# Patient Record
Sex: Female | Born: 1955
Health system: Southern US, Community
[De-identification: ages and names within clinical notes are randomized; demographics above are authoritative.]

## PROBLEM LIST (undated history)

## (undated) DIAGNOSIS — J189 Pneumonia, unspecified organism: Secondary | ICD-10-CM

## (undated) DIAGNOSIS — F419 Anxiety disorder, unspecified: Secondary | ICD-10-CM

## (undated) DIAGNOSIS — F32A Depression, unspecified: Secondary | ICD-10-CM

## (undated) DIAGNOSIS — F329 Major depressive disorder, single episode, unspecified: Secondary | ICD-10-CM

## (undated) HISTORY — PX: TONSILLECTOMY: SUR1361

## (undated) HISTORY — PX: OTHER SURGICAL HISTORY: SHX169

## (undated) HISTORY — PX: CHEST TUBE INSERTION: SHX231

---

## 2002-06-19 ENCOUNTER — Inpatient Hospital Stay (HOSPITAL_COMMUNITY): Admission: EM | Admit: 2002-06-19 | Discharge: 2002-06-30 | Payer: Self-pay | Admitting: Thoracic Surgery

## 2002-06-20 ENCOUNTER — Encounter: Payer: Self-pay | Admitting: Critical Care Medicine

## 2002-06-20 ENCOUNTER — Encounter: Payer: Self-pay | Admitting: Thoracic Surgery

## 2002-06-21 ENCOUNTER — Encounter: Payer: Self-pay | Admitting: Thoracic Surgery

## 2002-06-22 ENCOUNTER — Encounter: Payer: Self-pay | Admitting: Thoracic Surgery

## 2002-06-23 ENCOUNTER — Encounter: Payer: Self-pay | Admitting: Thoracic Surgery

## 2002-06-24 ENCOUNTER — Encounter: Payer: Self-pay | Admitting: Thoracic Surgery

## 2002-06-25 ENCOUNTER — Encounter: Payer: Self-pay | Admitting: Thoracic Surgery

## 2002-06-26 ENCOUNTER — Encounter: Payer: Self-pay | Admitting: Thoracic Surgery

## 2002-06-27 ENCOUNTER — Encounter: Payer: Self-pay | Admitting: Thoracic Surgery

## 2002-06-28 ENCOUNTER — Encounter: Payer: Self-pay | Admitting: Thoracic Surgery

## 2002-06-29 ENCOUNTER — Encounter: Payer: Self-pay | Admitting: Thoracic Surgery

## 2002-06-30 ENCOUNTER — Encounter: Payer: Self-pay | Admitting: Thoracic Surgery

## 2002-07-10 ENCOUNTER — Encounter: Admission: RE | Admit: 2002-07-10 | Discharge: 2002-07-10 | Payer: Self-pay | Admitting: Thoracic Surgery

## 2002-07-10 ENCOUNTER — Encounter: Payer: Self-pay | Admitting: Thoracic Surgery

## 2002-08-07 ENCOUNTER — Encounter: Payer: Self-pay | Admitting: Thoracic Surgery

## 2002-08-07 ENCOUNTER — Encounter: Admission: RE | Admit: 2002-08-07 | Discharge: 2002-08-07 | Payer: Self-pay | Admitting: Thoracic Surgery

## 2002-09-04 ENCOUNTER — Encounter: Payer: Self-pay | Admitting: Thoracic Surgery

## 2002-09-04 ENCOUNTER — Encounter: Admission: RE | Admit: 2002-09-04 | Discharge: 2002-09-04 | Payer: Self-pay | Admitting: Thoracic Surgery

## 2002-11-06 ENCOUNTER — Encounter: Admission: RE | Admit: 2002-11-06 | Discharge: 2002-11-06 | Payer: Self-pay | Admitting: Thoracic Surgery

## 2002-11-06 ENCOUNTER — Encounter: Payer: Self-pay | Admitting: Thoracic Surgery

## 2003-02-25 ENCOUNTER — Encounter: Payer: Self-pay | Admitting: Thoracic Surgery

## 2003-02-25 ENCOUNTER — Encounter: Admission: RE | Admit: 2003-02-25 | Discharge: 2003-02-25 | Payer: Self-pay | Admitting: Thoracic Surgery

## 2015-06-07 DIAGNOSIS — R05 Cough: Secondary | ICD-10-CM | POA: Diagnosis not present

## 2015-06-07 DIAGNOSIS — J00 Acute nasopharyngitis [common cold]: Secondary | ICD-10-CM | POA: Diagnosis not present

## 2015-06-17 DIAGNOSIS — R05 Cough: Secondary | ICD-10-CM | POA: Diagnosis not present

## 2015-06-17 DIAGNOSIS — J209 Acute bronchitis, unspecified: Secondary | ICD-10-CM | POA: Diagnosis not present

## 2015-08-08 DIAGNOSIS — M722 Plantar fascial fibromatosis: Secondary | ICD-10-CM | POA: Diagnosis not present

## 2016-03-02 DIAGNOSIS — Z23 Encounter for immunization: Secondary | ICD-10-CM | POA: Diagnosis not present

## 2016-03-05 DIAGNOSIS — Z6837 Body mass index (BMI) 37.0-37.9, adult: Secondary | ICD-10-CM | POA: Diagnosis not present

## 2016-03-05 DIAGNOSIS — Z01419 Encounter for gynecological examination (general) (routine) without abnormal findings: Secondary | ICD-10-CM | POA: Diagnosis not present

## 2016-03-19 DIAGNOSIS — H5200 Hypermetropia, unspecified eye: Secondary | ICD-10-CM | POA: Diagnosis not present

## 2016-03-19 DIAGNOSIS — Z0100 Encounter for examination of eyes and vision without abnormal findings: Secondary | ICD-10-CM | POA: Diagnosis not present

## 2016-04-19 DIAGNOSIS — Z1231 Encounter for screening mammogram for malignant neoplasm of breast: Secondary | ICD-10-CM | POA: Diagnosis not present

## 2016-06-18 DIAGNOSIS — J019 Acute sinusitis, unspecified: Secondary | ICD-10-CM | POA: Diagnosis not present

## 2016-06-28 DIAGNOSIS — J209 Acute bronchitis, unspecified: Secondary | ICD-10-CM | POA: Diagnosis not present

## 2016-06-28 DIAGNOSIS — J019 Acute sinusitis, unspecified: Secondary | ICD-10-CM | POA: Diagnosis not present

## 2016-07-18 DIAGNOSIS — R69 Illness, unspecified: Secondary | ICD-10-CM | POA: Diagnosis not present

## 2016-08-21 DIAGNOSIS — M1991 Primary osteoarthritis, unspecified site: Secondary | ICD-10-CM | POA: Diagnosis not present

## 2016-08-21 DIAGNOSIS — Z0000 Encounter for general adult medical examination without abnormal findings: Secondary | ICD-10-CM | POA: Diagnosis not present

## 2016-08-21 DIAGNOSIS — R69 Illness, unspecified: Secondary | ICD-10-CM | POA: Diagnosis not present

## 2016-08-21 DIAGNOSIS — Z6838 Body mass index (BMI) 38.0-38.9, adult: Secondary | ICD-10-CM | POA: Diagnosis not present

## 2016-08-21 DIAGNOSIS — Z791 Long term (current) use of non-steroidal anti-inflammatories (NSAID): Secondary | ICD-10-CM | POA: Diagnosis not present

## 2016-10-18 DIAGNOSIS — Z6837 Body mass index (BMI) 37.0-37.9, adult: Secondary | ICD-10-CM | POA: Diagnosis not present

## 2016-10-18 DIAGNOSIS — J209 Acute bronchitis, unspecified: Secondary | ICD-10-CM | POA: Diagnosis not present

## 2016-11-06 DIAGNOSIS — R69 Illness, unspecified: Secondary | ICD-10-CM | POA: Diagnosis not present

## 2017-02-25 DIAGNOSIS — R69 Illness, unspecified: Secondary | ICD-10-CM | POA: Diagnosis not present

## 2017-03-07 DIAGNOSIS — N3001 Acute cystitis with hematuria: Secondary | ICD-10-CM | POA: Diagnosis not present

## 2017-03-07 DIAGNOSIS — R300 Dysuria: Secondary | ICD-10-CM | POA: Diagnosis not present

## 2017-03-07 DIAGNOSIS — Z6837 Body mass index (BMI) 37.0-37.9, adult: Secondary | ICD-10-CM | POA: Diagnosis not present

## 2017-03-22 DIAGNOSIS — E6609 Other obesity due to excess calories: Secondary | ICD-10-CM | POA: Diagnosis not present

## 2017-03-22 DIAGNOSIS — M179 Osteoarthritis of knee, unspecified: Secondary | ICD-10-CM | POA: Diagnosis not present

## 2017-03-22 DIAGNOSIS — M722 Plantar fascial fibromatosis: Secondary | ICD-10-CM | POA: Diagnosis not present

## 2017-03-22 DIAGNOSIS — K219 Gastro-esophageal reflux disease without esophagitis: Secondary | ICD-10-CM | POA: Diagnosis not present

## 2017-03-25 ENCOUNTER — Other Ambulatory Visit (INDEPENDENT_AMBULATORY_CARE_PROVIDER_SITE_OTHER): Payer: Self-pay | Admitting: Orthopedic Surgery

## 2017-03-25 ENCOUNTER — Emergency Department (HOSPITAL_COMMUNITY): Payer: Medicare HMO

## 2017-03-25 ENCOUNTER — Emergency Department (HOSPITAL_COMMUNITY)
Admission: EM | Admit: 2017-03-25 | Discharge: 2017-03-25 | Disposition: A | Discharge status: Home / Self Care | Payer: Medicare HMO | Attending: Emergency Medicine | Admitting: Emergency Medicine

## 2017-03-25 ENCOUNTER — Encounter (HOSPITAL_COMMUNITY): Payer: Self-pay | Admitting: Emergency Medicine

## 2017-03-25 DIAGNOSIS — W010XXA Fall on same level from slipping, tripping and stumbling without subsequent striking against object, initial encounter: Secondary | ICD-10-CM | POA: Insufficient documentation

## 2017-03-25 DIAGNOSIS — T148XXA Other injury of unspecified body region, initial encounter: Secondary | ICD-10-CM | POA: Diagnosis not present

## 2017-03-25 DIAGNOSIS — S82892A Other fracture of left lower leg, initial encounter for closed fracture: Secondary | ICD-10-CM

## 2017-03-25 DIAGNOSIS — S82842A Displaced bimalleolar fracture of left lower leg, initial encounter for closed fracture: Secondary | ICD-10-CM | POA: Diagnosis not present

## 2017-03-25 DIAGNOSIS — Y998 Other external cause status: Secondary | ICD-10-CM | POA: Insufficient documentation

## 2017-03-25 DIAGNOSIS — S99912A Unspecified injury of left ankle, initial encounter: Secondary | ICD-10-CM | POA: Diagnosis present

## 2017-03-25 DIAGNOSIS — Y929 Unspecified place or not applicable: Secondary | ICD-10-CM | POA: Insufficient documentation

## 2017-03-25 DIAGNOSIS — M25572 Pain in left ankle and joints of left foot: Secondary | ICD-10-CM | POA: Diagnosis not present

## 2017-03-25 DIAGNOSIS — Y9389 Activity, other specified: Secondary | ICD-10-CM | POA: Insufficient documentation

## 2017-03-25 DIAGNOSIS — Y93H3 Activity, building and construction: Secondary | ICD-10-CM | POA: Insufficient documentation

## 2017-03-25 MED ORDER — OXYCODONE-ACETAMINOPHEN 5-325 MG PO TABS: 2.0000 | ORAL_TABLET | ORAL | 0 refills | Status: DC | PRN

## 2017-03-25 MED ORDER — HYDROMORPHONE HCL 2 MG/ML IJ SOLN
2.0000 mg | Freq: Once | INTRAMUSCULAR | Status: AC
  Administered 2017-03-25: 2 mg via INTRAMUSCULAR
  Filled 2017-03-25: qty 1

## 2017-03-25 MED ORDER — ONDANSETRON HCL 4 MG/2ML IJ SOLN
4.0000 mg | Freq: Once | INTRAMUSCULAR | Status: AC
  Administered 2017-03-25: 4 mg via INTRAMUSCULAR
  Filled 2017-03-25: qty 2

## 2017-03-25 NOTE — ED Triage Notes (Signed)
Pt reports left ankle pain after slipping on wet ramp and left ankle folding underneath her. Strong dp pulse palpated. Moderate swelling noted to left ankle. Pt denies hitting head or loc. nad noted.

## 2017-03-25 NOTE — ED Provider Notes (Signed)
[facility] DEPT Provider Note   CSN: [id number] Arrival date & time: 03/25/17  1329     History   Chief Complaint Chief Complaint  Patient presents with  . Ankle Injury    HPI Melissa Landry is a 61 y.o. female.  The history is provided by the patient. No language interpreter was used.  Ankle Injury  This is a new problem. The current episode started less than 1 hour ago. The problem occurs constantly. The problem has been gradually worsening. Nothing aggravates the symptoms. Nothing relieves the symptoms. She has tried nothing for the symptoms. The treatment provided no relief.  Pt reports she slipped on a wet ramp and injured her left ankle.  Pt complains of pain.    History reviewed. No pertinent past medical history.  There are no active problems to display for this patient.   History reviewed. No pertinent surgical history.  OB History    No data available       Home Medications    Prior to Admission medications   Not on File    Family History History reviewed. No pertinent family history.  Social History Social History  Substance Use Topics  . Smoking status: Never Smoker  . Smokeless tobacco: Never Used  . Alcohol use No     Allergies   Patient has no allergy information on record.   Review of Systems Review of Systems  All other systems reviewed and are negative.    Physical Exam Updated Vital Signs BP (!) 105/50 (BP Location: Left Arm)   Pulse (!) 56   Temp 98.1 F (36.7 C) (Oral)   Resp 18   Ht  (1.676 m)   Wt 99.8 kg (220 lb)   SpO2 99%   BMI 35.51 kg/m   Physical Exam  Constitutional: She appears well-developed and well-nourished.  HENT:  Head: Normocephalic.  Musculoskeletal: She exhibits tenderness.  Swollen tender left ankle,  Good pulse  Neurological: She is alert.  Skin: Skin is warm.  Psychiatric: She has a normal mood and affect.  Nursing note and vitals reviewed.    ED Treatments / Results   Labs (all labs ordered are listed, but only abnormal results are displayed) Labs Reviewed - No data to display  EKG  EKG Interpretation None       Radiology Dg Ankle Complete Left  Result Date: 03/25/2017 CLINICAL DATA:  Fall, injury, pain and swelling EXAM: LEFT ANKLE COMPLETE - 3+ VIEW COMPARISON:  None available FINDINGS: Acute mildly displaced bimalleolar left ankle fracture with soft tissue swelling. No associated subluxation or dislocation. Talus and calcaneus appear intact. Plantar calcaneal spurring noted. IMPRESSION: Acute mildly displaced bimalleolar ankle fractures and soft tissue swelling. Electronically Signed   By: Judie Petit.  Shick M.D.   On: 03/25/2017 13:59    Procedures .Splint Application Date/Time: 03/25/2017 3:11 PM Performed by: Elson Areas Authorized by: Elson Areas   Consent:    Consent obtained:  Verbal   Consent given by:  Patient   Alternatives discussed:  No treatment Pre-procedure details:    Sensation:  Normal Procedure details:    Laterality:  Left   Location:  Ankle   Cast type:  Short leg   (including critical care time)  Medications Ordered in ED Medications  HYDROmorphone (DILAUDID) injection 2 mg (2 mg Intramuscular Given 03/25/17 1402)  ondansetron (ZOFRAN) injection 4 mg (4 mg Intramuscular Given 03/25/17 1401)     Initial Impression / Assessment and Plan / ED Course  I have reviewed the triage vital signs and the nursing notes.  Pertinent labs & imaging results that were available during my care of the patient were reviewed by me and considered in my medical decision making (see chart for details).     Splint Crutches  I spoke to Dr. August Saucer.  He will call pt with surgical time tomorrow.   Pt advised of need for surgery   Final Clinical Impressions(s) / ED Diagnoses   Final diagnoses:  Bimalleolar ankle fracture, left, closed, initial encounter    New Prescriptions New Prescriptions   OXYCODONE-ACETAMINOPHEN  (PERCOCET/ROXICET) 5-325 MG TABLET    Take 2 tablets by mouth every 4 (four) hours as needed for severe pain.     Elson Areas, [place] 03/25/17 1515    Loren Racer, MD 03/25/17 1525

## 2017-03-25 NOTE — Discharge Instructions (Signed)
Sr. Dean's office will call you with OR time.  Do not eat or drink after midnight.

## 2017-03-26 ENCOUNTER — Inpatient Hospital Stay (HOSPITAL_COMMUNITY): Payer: Medicare HMO | Admitting: Anesthesiology

## 2017-03-26 ENCOUNTER — Ambulatory Visit (HOSPITAL_COMMUNITY)
Admission: RE | Admit: 2017-03-26 | Discharge: 2017-03-26 | Disposition: A | Discharge status: Home / Self Care | Payer: Medicare HMO | Source: Ambulatory Visit | Attending: Orthopedic Surgery | Admitting: Orthopedic Surgery

## 2017-03-26 ENCOUNTER — Ambulatory Visit (HOSPITAL_COMMUNITY): Payer: Medicare HMO

## 2017-03-26 ENCOUNTER — Encounter (HOSPITAL_COMMUNITY): Payer: Self-pay | Admitting: *Deleted

## 2017-03-26 ENCOUNTER — Encounter (HOSPITAL_COMMUNITY)
Admission: RE | Disposition: A | Discharge status: Home / Self Care | Payer: Self-pay | Source: Ambulatory Visit | Attending: Orthopedic Surgery

## 2017-03-26 DIAGNOSIS — S8262XA Displaced fracture of lateral malleolus of left fibula, initial encounter for closed fracture: Secondary | ICD-10-CM | POA: Insufficient documentation

## 2017-03-26 DIAGNOSIS — Z79899 Other long term (current) drug therapy: Secondary | ICD-10-CM | POA: Diagnosis not present

## 2017-03-26 DIAGNOSIS — Z419 Encounter for procedure for purposes other than remedying health state, unspecified: Secondary | ICD-10-CM

## 2017-03-26 DIAGNOSIS — S82842A Displaced bimalleolar fracture of left lower leg, initial encounter for closed fracture: Secondary | ICD-10-CM | POA: Diagnosis not present

## 2017-03-26 DIAGNOSIS — X58XXXA Exposure to other specified factors, initial encounter: Secondary | ICD-10-CM | POA: Insufficient documentation

## 2017-03-26 DIAGNOSIS — S82892A Other fracture of left lower leg, initial encounter for closed fracture: Secondary | ICD-10-CM

## 2017-03-26 DIAGNOSIS — F419 Anxiety disorder, unspecified: Secondary | ICD-10-CM | POA: Diagnosis not present

## 2017-03-26 DIAGNOSIS — F329 Major depressive disorder, single episode, unspecified: Secondary | ICD-10-CM | POA: Diagnosis not present

## 2017-03-26 DIAGNOSIS — S82899A Other fracture of unspecified lower leg, initial encounter for closed fracture: Secondary | ICD-10-CM | POA: Diagnosis not present

## 2017-03-26 DIAGNOSIS — R69 Illness, unspecified: Secondary | ICD-10-CM | POA: Diagnosis not present

## 2017-03-26 DIAGNOSIS — M25572 Pain in left ankle and joints of left foot: Secondary | ICD-10-CM | POA: Diagnosis present

## 2017-03-26 HISTORY — DX: Anxiety disorder, unspecified: F41.9

## 2017-03-26 HISTORY — PX: ORIF ANKLE FRACTURE: SHX5408

## 2017-03-26 HISTORY — DX: Pneumonia, unspecified organism: J18.9

## 2017-03-26 HISTORY — DX: Major depressive disorder, single episode, unspecified: F32.9

## 2017-03-26 HISTORY — DX: Depression, unspecified: F32.A

## 2017-03-26 SURGERY — OPEN REDUCTION INTERNAL FIXATION (ORIF) ANKLE FRACTURE
Anesthesia: General | Site: Ankle | Laterality: Left

## 2017-03-26 MED ORDER — CLONIDINE HCL (ANALGESIA) 100 MCG/ML EP SOLN
EPIDURAL | Status: DC | PRN
  Administered 2017-03-26: 1000 ug

## 2017-03-26 MED ORDER — MIDAZOLAM HCL 5 MG/5ML IJ SOLN
INTRAMUSCULAR | Status: DC | PRN
  Administered 2017-03-26: 2 mg via INTRAVENOUS

## 2017-03-26 MED ORDER — OXYCODONE HCL 5 MG PO TABS: 5.0000 mg | ORAL_TABLET | ORAL | 0 refills | Status: DC | PRN

## 2017-03-26 MED ORDER — LIDOCAINE 2% (20 MG/ML) 5 ML SYRINGE
INTRAMUSCULAR | Status: AC
  Filled 2017-03-26: qty 5

## 2017-03-26 MED ORDER — FENTANYL CITRATE (PF) 100 MCG/2ML IJ SOLN
INTRAMUSCULAR | Status: DC | PRN
  Administered 2017-03-26 (×2): 50 ug via INTRAVENOUS

## 2017-03-26 MED ORDER — ONDANSETRON HCL 4 MG/2ML IJ SOLN
INTRAMUSCULAR | Status: AC
  Filled 2017-03-26: qty 2

## 2017-03-26 MED ORDER — METHOCARBAMOL 500 MG PO TABS: 500.0000 mg | ORAL_TABLET | Freq: Three times a day (TID) | ORAL | 0 refills | Status: DC | PRN

## 2017-03-26 MED ORDER — DEXAMETHASONE SODIUM PHOSPHATE 10 MG/ML IJ SOLN
INTRAMUSCULAR | Status: AC
  Filled 2017-03-26: qty 1

## 2017-03-26 MED ORDER — PROMETHAZINE HCL 25 MG/ML IJ SOLN: 6.2500 mg | INTRAMUSCULAR | Status: DC | PRN

## 2017-03-26 MED ORDER — MEPERIDINE HCL 25 MG/ML IJ SOLN
6.2500 mg | INTRAMUSCULAR | Status: DC | PRN
  Administered 2017-03-26: 12.5 mg via INTRAVENOUS

## 2017-03-26 MED ORDER — DEXAMETHASONE SODIUM PHOSPHATE 10 MG/ML IJ SOLN
INTRAMUSCULAR | Status: DC | PRN
  Administered 2017-03-26: 10 mg via INTRAVENOUS

## 2017-03-26 MED ORDER — CEFAZOLIN SODIUM-DEXTROSE 2-4 GM/100ML-% IV SOLN
2.0000 g | INTRAVENOUS | Status: AC
  Administered 2017-03-26: 2 g via INTRAVENOUS
  Filled 2017-03-26: qty 100

## 2017-03-26 MED ORDER — HYDROMORPHONE HCL 1 MG/ML IJ SOLN
INTRAMUSCULAR | Status: AC
  Filled 2017-03-26: qty 1

## 2017-03-26 MED ORDER — ONDANSETRON HCL 4 MG/2ML IJ SOLN
INTRAMUSCULAR | Status: DC | PRN
  Administered 2017-03-26: 4 mg via INTRAVENOUS

## 2017-03-26 MED ORDER — 0.9 % SODIUM CHLORIDE (POUR BTL) OPTIME
TOPICAL | Status: DC | PRN
Start: 2017-03-26 — End: 2017-03-26
  Administered 2017-03-26: 1000 mL

## 2017-03-26 MED ORDER — OXYCODONE HCL 5 MG PO TABS
5.0000 mg | ORAL_TABLET | Freq: Once | ORAL | Status: AC | PRN
  Administered 2017-03-26: 5 mg via ORAL

## 2017-03-26 MED ORDER — ASPIRIN EC 325 MG PO TBEC: 325.0000 mg | DELAYED_RELEASE_TABLET | Freq: Every day | ORAL | 0 refills | Status: DC

## 2017-03-26 MED ORDER — HYDROMORPHONE HCL 1 MG/ML IJ SOLN
0.2500 mg | INTRAMUSCULAR | Status: DC | PRN
  Administered 2017-03-26: 0.25 mg via INTRAVENOUS

## 2017-03-26 MED ORDER — LIDOCAINE 2% (20 MG/ML) 5 ML SYRINGE
INTRAMUSCULAR | Status: DC | PRN
  Administered 2017-03-26: 60 mg via INTRAVENOUS

## 2017-03-26 MED ORDER — BUPIVACAINE HCL (PF) 0.25 % IJ SOLN: INTRAMUSCULAR | Status: DC | PRN

## 2017-03-26 MED ORDER — MIDAZOLAM HCL 2 MG/2ML IJ SOLN
INTRAMUSCULAR | Status: AC
  Filled 2017-03-26: qty 2

## 2017-03-26 MED ORDER — CLONIDINE HCL (ANALGESIA) 100 MCG/ML EP SOLN
150.0000 ug | Freq: Once | EPIDURAL | Status: DC
  Filled 2017-03-26: qty 1.5

## 2017-03-26 MED ORDER — LACTATED RINGERS IV SOLN
INTRAVENOUS | Status: DC
Start: 2017-03-26 — End: 2017-03-27
  Administered 2017-03-26 (×2): via INTRAVENOUS

## 2017-03-26 MED ORDER — OXYCODONE HCL 5 MG/5ML PO SOLN: 5.0000 mg | Freq: Once | ORAL | Status: AC | PRN

## 2017-03-26 MED ORDER — PROPOFOL 10 MG/ML IV BOLUS
INTRAVENOUS | Status: DC | PRN
  Administered 2017-03-26: 200 mg via INTRAVENOUS

## 2017-03-26 MED ORDER — FENTANYL CITRATE (PF) 250 MCG/5ML IJ SOLN
INTRAMUSCULAR | Status: AC
  Filled 2017-03-26: qty 5

## 2017-03-26 MED ORDER — MORPHINE SULFATE (PF) 4 MG/ML IV SOLN
INTRAVENOUS | Status: AC
  Filled 2017-03-26: qty 2

## 2017-03-26 MED ORDER — BUPIVACAINE HCL (PF) 0.5 % IJ SOLN
INTRAMUSCULAR | Status: AC
  Filled 2017-03-26: qty 30

## 2017-03-26 MED ORDER — MEPERIDINE HCL 25 MG/ML IJ SOLN
INTRAMUSCULAR | Status: AC
  Filled 2017-03-26: qty 1

## 2017-03-26 MED ORDER — CHLORHEXIDINE GLUCONATE 4 % EX LIQD: 60.0000 mL | Freq: Once | CUTANEOUS | Status: DC

## 2017-03-26 MED ORDER — BUPIVACAINE HCL (PF) 0.5 % IJ SOLN
INTRAMUSCULAR | Status: DC | PRN
  Administered 2017-03-26: 30 mL

## 2017-03-26 MED ORDER — MORPHINE SULFATE (PF) 4 MG/ML IV SOLN
INTRAVENOUS | Status: DC | PRN
  Administered 2017-03-26: 8 mg via SUBCUTANEOUS

## 2017-03-26 MED ORDER — OXYCODONE HCL 5 MG PO TABS
ORAL_TABLET | ORAL | Status: AC
  Filled 2017-03-26: qty 1

## 2017-03-26 MED ORDER — PROPOFOL 10 MG/ML IV BOLUS
INTRAVENOUS | Status: AC
  Filled 2017-03-26: qty 20

## 2017-03-26 SURGICAL SUPPLY — 77 items
BANDAGE ACE 4X5 VEL STRL LF (GAUZE/BANDAGES/DRESSINGS) ×2 IMPLANT
BIT DRILL OVR 3.5AO QC SHRT SM (DRILL) ×1 IMPLANT
BIT DRILL QC 2.0 SHORT EVOS SM (DRILL) ×1 IMPLANT
BIT DRILL QC 2.5MM SHRT EVO SM (DRILL) ×1 IMPLANT
BLADE SURG 10 STRL SS (BLADE) IMPLANT
BNDG COHESIVE 6X5 TAN STRL LF (GAUZE/BANDAGES/DRESSINGS) IMPLANT
BNDG ELASTIC 6X10 VLCR STRL LF (GAUZE/BANDAGES/DRESSINGS) ×2 IMPLANT
BNDG ESMARK 4X9 LF (GAUZE/BANDAGES/DRESSINGS) ×4 IMPLANT
BNDG GAUZE ELAST 4 BULKY (GAUZE/BANDAGES/DRESSINGS) ×2 IMPLANT
CONNECTOR CLSD OS 5.5 5.5ROD35 (Connector) ×2 IMPLANT
COVER MAYO STAND STRL (DRAPES) IMPLANT
COVER SURGICAL LIGHT HANDLE (MISCELLANEOUS) ×2 IMPLANT
CUFF TOURNIQUET SINGLE 34IN LL (TOURNIQUET CUFF) IMPLANT
CUFF TOURNIQUET SINGLE 44IN (TOURNIQUET CUFF) IMPLANT
DRAPE C-ARM 42X72 X-RAY (DRAPES) ×2 IMPLANT
DRAPE C-ARMOR (DRAPES) ×2 IMPLANT
DRAPE INCISE IOBAN 66X45 STRL (DRAPES) ×2 IMPLANT
DRAPE SURG 17X23 STRL (DRAPES) ×2 IMPLANT
DRAPE U-SHAPE 47X51 STRL (DRAPES) ×2 IMPLANT
DRESSING AQUACEL AG SP 3.5X6 (GAUZE/BANDAGES/DRESSINGS) ×1 IMPLANT
DRILL OVER 3.5 AO QC SHORT SM (DRILL) ×2
DRILL QC 2.0 SHORT EVOS SM (DRILL) ×2
DRILL QC 2.5MM SHORT EVOS SM (DRILL) ×2
DRSG ADAPTIC 3X8 NADH LF (GAUZE/BANDAGES/DRESSINGS) ×2 IMPLANT
DRSG AQUACEL AG SP 3.5X6 (GAUZE/BANDAGES/DRESSINGS) ×2
DRSG PAD ABDOMINAL 8X10 ST (GAUZE/BANDAGES/DRESSINGS) ×2 IMPLANT
DURAPREP 26ML APPLICATOR (WOUND CARE) ×2 IMPLANT
ELECT CAUTERY BLADE 6.4 (BLADE) ×2 IMPLANT
ELECT REM PT RETURN 9FT ADLT (ELECTROSURGICAL) ×2
ELECTRODE REM PT RTRN 9FT ADLT (ELECTROSURGICAL) ×1 IMPLANT
GAUZE SPONGE 4X4 12PLY STRL (GAUZE/BANDAGES/DRESSINGS) ×2 IMPLANT
GAUZE XEROFORM 5X9 LF (GAUZE/BANDAGES/DRESSINGS) ×2 IMPLANT
GLOVE BIOGEL PI IND STRL 7.5 (GLOVE) ×1 IMPLANT
GLOVE BIOGEL PI IND STRL 8 (GLOVE) ×1 IMPLANT
GLOVE BIOGEL PI INDICATOR 7.5 (GLOVE) ×1
GLOVE BIOGEL PI INDICATOR 8 (GLOVE) ×1
GLOVE ECLIPSE 7.0 STRL STRAW (GLOVE) ×2 IMPLANT
GLOVE SURG ORTHO 8.0 STRL STRW (GLOVE) ×2 IMPLANT
GOWN STRL REUS W/ TWL LRG LVL3 (GOWN DISPOSABLE) ×3 IMPLANT
GOWN STRL REUS W/TWL LRG LVL3 (GOWN DISPOSABLE) ×3
HANDPIECE INTERPULSE COAX TIP (DISPOSABLE) ×1
KIT BASIN OR (CUSTOM PROCEDURE TRAY) ×2 IMPLANT
KIT ROOM TURNOVER OR (KITS) ×2 IMPLANT
MANIFOLD NEPTUNE II (INSTRUMENTS) ×2 IMPLANT
NEEDLE HYPO 25GX1X1/2 BEV (NEEDLE) ×2 IMPLANT
NS IRRIG 1000ML POUR BTL (IV SOLUTION) ×2 IMPLANT
PACK ORTHO EXTREMITY (CUSTOM PROCEDURE TRAY) ×2 IMPLANT
PAD ABD 8X10 STRL (GAUZE/BANDAGES/DRESSINGS) ×2 IMPLANT
PAD ARMBOARD 7.5X6 YLW CONV (MISCELLANEOUS) ×4 IMPLANT
PAD CAST 4YDX4 CTTN HI CHSV (CAST SUPPLIES) ×1 IMPLANT
PADDING CAST COTTON 4X4 STRL (CAST SUPPLIES) ×1
PLATE 5H L 81MM FIBULA EVOS (Plate) ×2 IMPLANT
SCREW CORT 2.7X12 EVOS (Screw) ×1 IMPLANT
SCREW CORT 2.7X14 T8 EVOS (Screw) ×4 IMPLANT
SCREW CORT 2.7X15 T8 ST EVOS (Screw) ×2 IMPLANT
SCREW CORT 2.7X16 STAR T8 EVOS (Screw) ×4 IMPLANT
SCREW CORT 3.5X14 ST EVOS (Screw) ×2 IMPLANT
SCREW CORT 3.5X20 ST EVOS (Screw) ×2 IMPLANT
SCREW CORT EVOS ST 3.5X12 (Screw) ×4 IMPLANT
SCREW CORT EVOS ST T8 2.7X14MM (Screw) ×1 IMPLANT
SET HNDPC FAN SPRY TIP SCT (DISPOSABLE) ×1 IMPLANT
SPLINT PLASTER CAST XFAST 5X30 (CAST SUPPLIES) ×1 IMPLANT
SPLINT PLASTER XFAST SET 5X30 (CAST SUPPLIES) ×1
STOCKINETTE IMPERVIOUS 9X36 MD (GAUZE/BANDAGES/DRESSINGS) ×2 IMPLANT
SUCTION FRAZIER HANDLE 10FR (MISCELLANEOUS) ×1
SUCTION TUBE FRAZIER 10FR DISP (MISCELLANEOUS) ×1 IMPLANT
SUT ETHILON 3 0 PS 1 (SUTURE) ×2 IMPLANT
SUT MNCRL AB 3-0 PS2 18 (SUTURE) IMPLANT
SUT VIC AB 2-0 CTB1 (SUTURE) ×2 IMPLANT
SUT VIC AB 3-0 SH 27 (SUTURE) ×1
SUT VIC AB 3-0 SH 27X BRD (SUTURE) ×1 IMPLANT
SYR CONTROL 10ML LL (SYRINGE) ×2 IMPLANT
TOWEL OR 17X24 6PK STRL BLUE (TOWEL DISPOSABLE) ×2 IMPLANT
TOWEL OR 17X26 10 PK STRL BLUE (TOWEL DISPOSABLE) ×2 IMPLANT
TUBE CONNECTING 12X1/4 (SUCTIONS) ×2 IMPLANT
WATER STERILE IRR 1000ML POUR (IV SOLUTION) ×2 IMPLANT
YANKAUER SUCT BULB TIP NO VENT (SUCTIONS) ×2 IMPLANT

## 2017-03-26 NOTE — Anesthesia Preprocedure Evaluation (Addendum)
Anesthesia Evaluation  Patient identified by MRN, date of birth, ID band Patient awake    Reviewed: Allergy & Precautions, NPO status , Patient's Chart, lab work & pertinent test results  Airway Mallampati: III  TM Distance: >3 FB Neck ROM: Full  Mouth opening: Limited Mouth Opening  Dental no notable dental hx.    Pulmonary neg pulmonary ROS,    Pulmonary exam normal breath sounds clear to auscultation       Cardiovascular negative cardio ROS Normal cardiovascular exam Rhythm:Regular Rate:Normal     Neuro/Psych Anxiety Depression negative neurological ROS     GI/Hepatic negative GI ROS, Neg liver ROS,   Endo/Other  negative endocrine ROS  Renal/GU negative Renal ROS     Musculoskeletal negative musculoskeletal ROS (+)   Abdominal (+) + obese,   Peds  Hematology negative hematology ROS (+)   Anesthesia Other Findings Motor intact to left foot. Patient reports decreased sensation to top of left foot  Reproductive/Obstetrics                            Anesthesia Physical Anesthesia Plan  ASA: II and emergent  Anesthesia Plan: General   Post-op Pain Management:    Induction: Intravenous  PONV Risk Score and Plan: 3 and Ondansetron, Dexamethasone and Midazolam  Airway Management Planned: LMA  Additional Equipment:   Intra-op Plan:   Post-operative Plan: Extubation in OR  Informed Consent: I have reviewed the patients History and Physical, chart, labs and discussed the procedure including the risks, benefits and alternatives for the proposed anesthesia with the patient or authorized representative who has indicated his/her understanding and acceptance.   Dental advisory given  Plan Discussed with: CRNA  Anesthesia Plan Comments:        Anesthesia Quick Evaluation

## 2017-03-26 NOTE — Anesthesia Postprocedure Evaluation (Signed)
Anesthesia Post Note  Patient: Melissa Landry  Procedure(s) Performed: Procedure(s) (LRB): OPEN REDUCTION INTERNAL FIXATION (ORIF) ANKLE FRACTURE (Left)     Patient location during evaluation: PACU Anesthesia Type: General Level of consciousness: awake and alert Pain management: pain level controlled Vital Signs Assessment: post-procedure vital signs reviewed and stable Respiratory status: spontaneous breathing, nonlabored ventilation, respiratory function stable and patient connected to nasal cannula oxygen Cardiovascular status: blood pressure returned to baseline and stable Postop Assessment: no apparent nausea or vomiting Anesthetic complications: no    Last Vitals:  Vitals:   03/26/17 2215 03/26/17 2230  BP: (!) 125/59 (!) 125/59  Pulse: 68 67  Resp: 16 16  Temp:  36.5 C  SpO2: 97% 94%    Last Pain:  Vitals:   03/26/17 2230  TempSrc:   PainSc: 3                  Beryle Lathe

## 2017-03-26 NOTE — Anesthesia Procedure Notes (Signed)
Procedure Name: LMA Insertion Date/Time: 03/26/2017 8:21 PM Performed by: Arlice Colt B Pre-anesthesia Checklist: Patient identified, Emergency Drugs available, Suction available and Patient being monitored Patient Re-evaluated:Patient Re-evaluated prior to induction Oxygen Delivery Method: Circle System Utilized Preoxygenation: Pre-oxygenation with 100% oxygen Induction Type: IV induction Ventilation: Mask ventilation without difficulty LMA: LMA inserted LMA Size: 4.0 Number of attempts: 1 Airway Equipment and Method: Bite block Placement Confirmation: positive ETCO2 Tube secured with: Tape Dental Injury: Teeth and Oropharynx as per pre-operative assessment

## 2017-03-26 NOTE — H&P (Signed)
Melissa Landry is an 61 y.o. female.   Chief Complaint: Left ankle pain  HPI: Melissa Landry is a 61 year old female who injured her left ankle yesterday.  She was seen in the [facility] emergency room.  Bimalleolar ankle fracture was diagnosed.  She was placed in a splint.  Today she reports a little bit of paresthesias on the top of her foot.  She does have swelling in the leg.  She denies any family or personal history of deep vein thrombosis or pulmonary embolism Past Medical History:  Diagnosis Date  . Anxiety   . Depression   . Pneumonia     Past Surgical History:  Procedure Laterality Date  . CHEST TUBE INSERTION     for PNA  . chest tube removal    . TONSILLECTOMY     age 71    History reviewed. No pertinent family history. Social History:  reports that she has never smoked. She has never used smokeless tobacco. She reports that she does not drink alcohol or use drugs.  Allergies: No Known Allergies  Medications Prior to Admission  Medication Sig Dispense Refill  . ALPRAZolam (XANAX) 0.5 MG tablet Take 0.5 mg by mouth daily as needed for anxiety.  3  . Cyanocobalamin (B-12 PO) Take by mouth.    Marland Kitchen FLUoxetine (PROZAC) 20 MG capsule Take 20 mg by mouth at bedtime.  3  . lamoTRIgine (LAMICTAL) 200 MG tablet Take 200 mg by mouth at bedtime.  3  . Multiple Vitamins-Minerals (HAIR SKIN NAILS PO) Take by mouth.    . oxyCODONE-acetaminophen (PERCOCET/ROXICET) 5-325 MG tablet Take 2 tablets by mouth every 4 (four) hours as needed for severe pain. 15 tablet 0  . topiramate (TOPAMAX) 50 MG tablet Take 50 mg by mouth 2 (two) times daily.  0  . traZODone (DESYREL) 50 MG tablet Take 25 mg by mouth at bedtime as needed for sleep.   2    No results found for this or any previous visit (from the past 48 hour(s)). Dg Ankle Complete Left  Result Date: 03/25/2017 CLINICAL DATA:  Fall, injury, pain and swelling EXAM: LEFT ANKLE COMPLETE - 3+ VIEW COMPARISON:  None available FINDINGS: Acute mildly  displaced bimalleolar left ankle fracture with soft tissue swelling. No associated subluxation or dislocation. Talus and calcaneus appear intact. Plantar calcaneal spurring noted. IMPRESSION: Acute mildly displaced bimalleolar ankle fractures and soft tissue swelling. Electronically Signed   By: Judie Petit.  Shick M.D.   On: 03/25/2017 13:59    Review of Systems  Musculoskeletal: Positive for joint pain.  All other systems reviewed and are negative.   Blood pressure 111/69, pulse 61, temperature 98.4 F (36.9 C), temperature source Oral, resp. rate 18, height  (1.676 m), weight 220 lb (99.8 kg), SpO2 100 %. Physical Exam  Constitutional: She appears well-developed.  HENT:  Head: Normocephalic.  Eyes: Pupils are equal, round, and reactive to light.  Neck: Normal range of motion.  Cardiovascular: Normal rate.   Respiratory: Effort normal.  Neurological: She is alert.  Skin: Skin is warm.  Psychiatric: She has a normal mood and affect.   left foot is examined.  Patient does have a fracture blister medially which was not present yesterday.  Patient does have swelling around the ankle but her compartments are soft.  Mild paresthesias dorsal aspect of the foot.  No plantar paresthesias.  Pedal pulses palpable.  No knee effusion.  Assessment/Plan Impression is bimalleolar ankle fracture with fracture blister which is developed over  the past 18 hours.  Plan is to address the lateral side for stability and come back again in 3 weeks to address the medial side.  Risks and benefits of surgery are discussed including not limited to infection or vessel damage potential need for more surgery which in her case is definite because of this fracture blister.  All questions answered.  Need for elevation after surgery discussed.  Plan to use aspirin for DVT prophylaxis.  I will refill her oxycodone as well.  Burnard Bunting, MD 03/26/2017, 4:43 PM

## 2017-03-26 NOTE — Brief Op Note (Signed)
03/26/2017  9:48 PM  PATIENT:  Melissa Landry  61 y.o. female  PRE-OPERATIVE DIAGNOSIS:  LEFT BIMALLEOLAR ANKLE FRACTURE  POST-OPERATIVE DIAGNOSIS:  LEFT BIMALLEOLAR ANKLE FRACTURE  PROCEDURE:  Procedure(s): OPEN REDUCTION INTERNAL FIXATION (ORIF) ANKLE FRACTURE  SURGEON:  Surgeon(s): Cammy Copa, MD  ASSISTANT: none  ANESTHESIA:   general  EBL: 25 ml    Total I/O In: 1000 [I.V.:1000] Out: -   BLOOD ADMINISTERED: none  DRAINS: none   LOCAL MEDICATIONS USED:  Marcaine mso4 clonidine  SPECIMEN:  No Specimen  COUNTS:  YES  TOURNIQUET:    DICTATION: .Other Dictation: Dictation Number [id number]  PLAN OF CARE: Discharge to home after PACU  PATIENT DISPOSITION:  PACU - hemodynamically stable

## 2017-03-26 NOTE — Progress Notes (Signed)
Report given to Carrie, CRNA 

## 2017-03-26 NOTE — Transfer of Care (Signed)
Immediate Anesthesia Transfer of Care Note  Patient: Melissa Landry  Procedure(s) Performed: Procedure(s): OPEN REDUCTION INTERNAL FIXATION (ORIF) ANKLE FRACTURE (Left)  Patient Location: PACU  Anesthesia Type:General  Level of Consciousness: awake, alert  and oriented  Airway & Oxygen Therapy: Patient Spontanous Breathing and Patient connected to face mask oxygen  Post-op Assessment: Report given to RN and Post -op Vital signs reviewed and stable  Post vital signs: Reviewed and stable  Last Vitals:  Vitals:   03/26/17 1608 03/26/17 2152  BP: 111/69   Pulse: 61 71  Resp: 18 17  Temp: 36.9 C 36.9 C  SpO2: 100%     Last Pain:  Vitals:   03/26/17 1608  TempSrc: Oral  PainSc: 5          Complications: No apparent anesthesia complications

## 2017-03-27 ENCOUNTER — Encounter (HOSPITAL_COMMUNITY): Payer: Self-pay | Admitting: Orthopedic Surgery

## 2017-03-27 NOTE — Op Note (Signed)
NAME:  Melissa Landry, Melissa Landry NO.:  [id number]  MEDICAL RECORD NO.:  [id number]  LOCATION:                                 FACILITY:  PHYSICIAN:  Burnard Bunting, M.D.         DATE OF BIRTH:  DATE OF PROCEDURE:  03/26/2017 DATE OF DISCHARGE:                              OPERATIVE REPORT   PREOPERATIVE DIAGNOSIS:  Bimalleolar ankle fracture.  POSTOPERATIVE DIAGNOSIS:  Bimalleolar ankle fracture.  PROCEDURE:  Left bimalleolar ankle fracture open reduction and internal fixation of the lateral malleolus.  SURGEON:  Burnard Bunting, M.D.  ASSISTANT:  None.  INDICATIONS:  Melissa Landry is the patient who injured her left ankle yesterday.  Bimalleolar ankle fracture was incurred.  She actually developed a fracture blister last night.  Presents now for operative management of the lateral malleolar fracture.  Medial side has fracture blister near the operative field and will be addressed later.  PROCEDURE IN DETAIL:  The patient was brought to the operating room where general anesthetic was induced.  Preoperative antibiotics were administered.  Time-out was called.  Left leg was prescrubbed with alcohol and Betadine, allowed to air dry, prepped with DuraPrep solution and draped in a sterile manner.  Melissa Landry was used to cover the operative field.  Leg was elevated and exsanguinated with an ankle Esmarch.  This was on for approximately 20 minutes.  Incision was made over the lateral malleolus extending proximally.  Care taken to avoid injury to the sensory branch of the peroneal nerve.  Fracture was identified, irrigated, reduced.  Lag screw placed proximal anterior to distal posterior.  Smith and Nephew plate applied, 8 cortices proximal and multiple locking screws distal.  Good reduction was achieved. Syndesmosis was stable.  Medial malleolus was well reduced after reducing the lateral malleolus.  Thorough irrigation was performed. Ankle Esmarch released.  Bleeding points  encountered and controlled using electrocautery.  Incision closed using interrupted inverted 2-0 Vicryl and 3-0 nylon.  Solution of Marcaine, morphine and clonidine injected into the skin for postop pain relief.  The patient tolerated the procedure well without immediate complications.  Well- padded posterior splint applied.  Follow up in 1 week.  Aquacel also placed over the dressing.  I will see her back in 1 week for clinical recheck.  Nonweightbearing left lower extremity.  Aspirin for DVT prophylaxis.     Burnard Bunting, M.D.   ______________________________ Reece Agar. Dorene Grebe, M.D.    GSD/MEDQ  D:  03/26/2017  T:  03/27/2017  Job:  [id number]

## 2017-03-29 ENCOUNTER — Other Ambulatory Visit (INDEPENDENT_AMBULATORY_CARE_PROVIDER_SITE_OTHER): Payer: Self-pay

## 2017-03-29 MED ORDER — BACLOFEN 10 MG PO TABS: 10.0000 mg | ORAL_TABLET | Freq: Three times a day (TID) | ORAL | 0 refills | Status: DC

## 2017-03-29 NOTE — Telephone Encounter (Signed)
Robaxin that you prescribed for patient post op is on back order. Sent in baclofen  1 po tid prn #30

## 2017-03-29 NOTE — Telephone Encounter (Signed)
thx

## 2017-03-31 NOTE — Discharge Summary (Signed)
Physician Discharge Summary  Patient ID: Melissa Landry MRN: [id number] DOB/AGE: 11-Apr-1956 61 y.o.  Admit date: 03/26/2017 Discharge date: 03/26/2017  Admission Diagnoses:  Active Problems:   * No active hospital problems. *   Discharge Diagnoses:  Same  Surgeries: Procedure(s): OPEN REDUCTION INTERNAL FIXATION (ORIF) ANKLE FRACTURE on 03/26/2017   Consultants:   Discharged Condition: Stable  Hospital Course: Melissa Landry is an 61 y.o. female who was admitted 03/26/2017 with a chief complaint of left ankle pain , and found to have a diagnosis of left bimalleolar ankle fracture.  They were brought to the operating room on 03/26/2017 and underwent the above named procedures.  Patient tolerated the procedure well.  She was discharged home from the recovery room.  Pain was controlled at the time of discharge.  She will remain nonweightbearing and elevate the leg.  She will require fixation of the medial malleolar fracture piece once the fracture blister has healed.  I'll see her back in a week for repeat clinical check   Antibiotics given:  Anti-infectives    Start     Dose/Rate Route Frequency Ordered Stop   03/27/17 0600  ceFAZolin (ANCEF) IVPB 2g/100 mL premix     2 g 200 mL/hr over 30 Minutes Intravenous On call to O.R. 03/26/17 1648 03/26/17 2055    .  Recent vital signs:  Vitals:   03/26/17 2215 03/26/17 2230  BP: (!) 125/59 (!) 125/59  Pulse: 68 67  Resp: 16 16  Temp:  97.7 F (36.5 C)  SpO2: 97% 94%    Recent laboratory studies: No results found for this or any previous visit.  Discharge Medications:   Allergies as of 03/26/2017   No Known Allergies     Medication List    TAKE these medications   ALPRAZolam 0.5 MG tablet Commonly known as:  XANAX Take 0.5 mg by mouth daily as needed for anxiety.   aspirin EC 325 MG tablet Take 1 tablet (325 mg total) by mouth daily.   B-12 PO Take by mouth.   FLUoxetine 20 MG capsule Commonly known as:   PROZAC Take 20 mg by mouth at bedtime.   HAIR SKIN NAILS PO Take by mouth.   lamoTRIgine 200 MG tablet Commonly known as:  LAMICTAL Take 200 mg by mouth at bedtime.   methocarbamol 500 MG tablet Commonly known as:  ROBAXIN Take 1 tablet (500 mg total) by mouth every 8 (eight) hours as needed for muscle spasms.   oxyCODONE 5 MG immediate release tablet Commonly known as:  Oxy IR/ROXICODONE Take 1 tablet (5 mg total) by mouth every 4 (four) hours as needed for severe pain.   oxyCODONE-acetaminophen 5-325 MG tablet Commonly known as:  PERCOCET/ROXICET Take 2 tablets by mouth every 4 (four) hours as needed for severe pain.   topiramate 50 MG tablet Commonly known as:  TOPAMAX Take 50 mg by mouth 2 (two) times daily.   traZODone 50 MG tablet Commonly known as:  DESYREL Take 25 mg by mouth at bedtime as needed for sleep.            Discharge Care Instructions        Start     Ordered   03/26/17 0000  Call MD / Call 911    Comments:  If you experience chest pain or shortness of breath, CALL 911 and be transported to the hospital emergency room.  If you develope a fever above 101 F, pus (white drainage) or increased drainage or  redness at the wound, or calf pain, call your surgeon's office.   03/26/17 1654   03/26/17 0000  Diet - low sodium heart healthy     03/26/17 1654   03/26/17 0000  Constipation Prevention    Comments:  Drink plenty of fluids.  Prune juice may be helpful.  You may use a stool softener, such as Colace (over the counter) 100 mg twice a day.  Use MiraLax (over the counter) for constipation as needed.   03/26/17 1654   03/26/17 0000  Increase activity slowly as tolerated     03/26/17 1654   03/26/17 0000  Discharge instructions    Comments:  Elevate left leg Nonweightbearing Keep splint dry Take aspirin daily   03/26/17 1654   03/26/17 0000  oxyCODONE (OXY IR/ROXICODONE) 5 MG immediate release tablet  Every 4 hours PRN     03/26/17 1654   03/26/17  0000  methocarbamol (ROBAXIN) 500 MG tablet  Every 8 hours PRN     03/26/17 1654   03/26/17 0000  aspirin EC 325 MG tablet  Daily     03/26/17 1654      Diagnostic Studies: Dg Ankle Complete Left  Result Date: 03/26/2017 CLINICAL DATA:  Ankle fracture EXAM: DG C-ARM 61-120 MIN; LEFT ANKLE COMPLETE - 3+ VIEW COMPARISON:  03/25/2017 FINDINGS: Total fluoroscopy time was 6 seconds. Three low resolution intraoperative spot views of the left ankle are submitted. The images demonstrate surgical plate and screw fixation of the distal fibular fracture. A medial malleolar fracture is noted. IMPRESSION: Intraoperative fluoroscopic assistance provided during surgical fixation of ankle fracture Electronically Signed   By: Jasmine Pang M.D.   On: 03/26/2017 21:50   Dg Ankle Complete Left  Result Date: 03/25/2017 CLINICAL DATA:  Fall, injury, pain and swelling EXAM: LEFT ANKLE COMPLETE - 3+ VIEW COMPARISON:  None available FINDINGS: Acute mildly displaced bimalleolar left ankle fracture with soft tissue swelling. No associated subluxation or dislocation. Talus and calcaneus appear intact. Plantar calcaneal spurring noted. IMPRESSION: Acute mildly displaced bimalleolar ankle fractures and soft tissue swelling. Electronically Signed   By: Judie Petit.  Shick M.D.   On: 03/25/2017 13:59   Dg C-arm 1-60 Min  Result Date: 03/26/2017 CLINICAL DATA:  Ankle fracture EXAM: DG C-ARM 61-120 MIN; LEFT ANKLE COMPLETE - 3+ VIEW COMPARISON:  03/25/2017 FINDINGS: Total fluoroscopy time was 6 seconds. Three low resolution intraoperative spot views of the left ankle are submitted. The images demonstrate surgical plate and screw fixation of the distal fibular fracture. A medial malleolar fracture is noted. IMPRESSION: Intraoperative fluoroscopic assistance provided during surgical fixation of ankle fracture Electronically Signed   By: Jasmine Pang M.D.   On: 03/26/2017 21:50    Disposition: 01-Home or Self Care  Discharge Instructions     Call MD / Call 911    Complete by:  As directed    If you experience chest pain or shortness of breath, CALL 911 and be transported to the hospital emergency room.  If you develope a fever above 101 F, pus (white drainage) or increased drainage or redness at the wound, or calf pain, call your surgeon's office.   Constipation Prevention    Complete by:  As directed    Drink plenty of fluids.  Prune juice may be helpful.  You may use a stool softener, such as Colace (over the counter) 100 mg twice a day.  Use MiraLax (over the counter) for constipation as needed.   Diet - low sodium heart healthy  Complete by:  As directed    Discharge instructions    Complete by:  As directed    Elevate left leg Nonweightbearing Keep splint dry Take aspirin daily   Increase activity slowly as tolerated    Complete by:  As directed          Signed: Burnard Bunting 03/31/2017, 8:51 AM

## 2017-04-03 ENCOUNTER — Encounter (INDEPENDENT_AMBULATORY_CARE_PROVIDER_SITE_OTHER): Payer: Self-pay | Admitting: Orthopedic Surgery

## 2017-04-03 ENCOUNTER — Ambulatory Visit (INDEPENDENT_AMBULATORY_CARE_PROVIDER_SITE_OTHER): Payer: Medicare HMO | Admitting: Orthopedic Surgery

## 2017-04-03 ENCOUNTER — Ambulatory Visit (INDEPENDENT_AMBULATORY_CARE_PROVIDER_SITE_OTHER): Payer: Medicare HMO

## 2017-04-03 DIAGNOSIS — S82892D Other fracture of left lower leg, subsequent encounter for closed fracture with routine healing: Secondary | ICD-10-CM

## 2017-04-03 MED ORDER — SILVER SULFADIAZINE 1 % EX CREA: 1.0000 "application " | TOPICAL_CREAM | Freq: Every day | CUTANEOUS | 0 refills | Status: DC

## 2017-04-06 NOTE — Progress Notes (Signed)
   Post-Op Visit Note   Patient: Melissa Landry           Date of Birth: 1956-02-12           MRN: [id number] Visit Date: 04/03/2017 PCP: [facility], [facility]   Assessment & Plan:  Chief Complaint:  Chief Complaint  Patient presents with  . Left Ankle - Routine Post Op   Visit Diagnoses:  1. Closed fracture of left ankle with routine healing, subsequent encounter     Plan: Melissa Landry is a patient who underwent left ankle fixation 03/26/2017.  On examination the sutures intact.  Fracture blister over the medial malleolus is improving.  I will apply Silvadene to that fracture blister plus dry dressing.  Fracture boot applied.  One week return just to look at the status of the skin and potentially decide on date for fixation of the medial malleolus.  No calf tenderness today negative Homans sign today continue with current DVT prophylaxis l  Follow-Up Instructions: Return in about 2 weeks (around 04/17/2017).   Orders:  Orders Placed This Encounter  Procedures  . XR Ankle Complete Left   Meds ordered this encounter  Medications  . silver sulfADIAZINE (SILVADENE) 1 % cream    Sig: Apply 1 application topically daily.    Dispense:  50 g    Refill:  0    Imaging: No results found.  PMFS History: There are no active problems to display for this patient.  Past Medical History:  Diagnosis Date  . Anxiety   . Depression   . Pneumonia     No family history on file.  Past Surgical History:  Procedure Laterality Date  . CHEST TUBE INSERTION     for PNA  . chest tube removal    . ORIF ANKLE FRACTURE Left 03/26/2017   Procedure: OPEN REDUCTION INTERNAL FIXATION (ORIF) ANKLE FRACTURE;  Surgeon: Cammy Copa, MD;  Location: [facility] OR;  Service: Orthopedics;  Laterality: Left;  . TONSILLECTOMY     age 57   Social History   Occupational History  . Not on file.   Social History Main Topics  . Smoking status: Never Smoker  . Smokeless tobacco: Never Used  .  Alcohol use No  . Drug use: No  . Sexual activity: Not on file

## 2017-04-10 ENCOUNTER — Ambulatory Visit (INDEPENDENT_AMBULATORY_CARE_PROVIDER_SITE_OTHER): Payer: Medicare HMO

## 2017-04-10 ENCOUNTER — Ambulatory Visit (INDEPENDENT_AMBULATORY_CARE_PROVIDER_SITE_OTHER): Payer: Medicare HMO | Admitting: Orthopedic Surgery

## 2017-04-10 ENCOUNTER — Encounter (INDEPENDENT_AMBULATORY_CARE_PROVIDER_SITE_OTHER): Payer: Self-pay | Admitting: Orthopedic Surgery

## 2017-04-10 DIAGNOSIS — S82892D Other fracture of left lower leg, subsequent encounter for closed fracture with routine healing: Secondary | ICD-10-CM

## 2017-04-10 NOTE — Progress Notes (Signed)
   Post-Op Visit Note   Patient: Melissa Landry           Date of Birth: [birth year]           MRN: [id number] Visit Date: 04/10/2017 PCP: [facility]   Assessment & Plan:  Chief Complaint:  Chief Complaint  Patient presents with  . Left Ankle - Routine Post Op   Visit Diagnoses:  1. Closed fracture of left ankle with routine healing, subsequent encounter     Plan: Melissa Landry is a 61 year old patient left ankle fracture open reduction internal fixation.  On examination still is having some skin issues where that fracture blister was on the medial side.  Lateral incision intact and sutures removed.  On her to continue nonweightbearing but work on range of motion exercises and elevation.  Ace wrap reapplied for compression.  Come back next weeks and we can recheck the skin.  Follow-Up Instructions: Return in about 1 week (around 04/17/2017).   Orders:  No orders of the defined types were placed in this encounter.  No orders of the defined types were placed in this encounter.   Imaging: No results found.  PMFS History: There are no active problems to display for this patient.  Past Medical History:  Diagnosis Date  . Anxiety   . Depression   . Pneumonia     No family history on file.  Past Surgical History:  Procedure Laterality Date  . CHEST TUBE INSERTION     for PNA  . chest tube removal    . ORIF ANKLE FRACTURE Left 03/26/2017   Procedure: OPEN REDUCTION INTERNAL FIXATION (ORIF) ANKLE FRACTURE;  Surgeon: Cammy Copa, MD;  Location: [facility] OR;  Service: Orthopedics;  Laterality: Left;  . TONSILLECTOMY     age 58   Social History   Occupational History  . Not on file.   Social History Main Topics  . Smoking status: Never Smoker  . Smokeless tobacco: Never Used  . Alcohol use No  . Drug use: No  . Sexual activity: Not on file

## 2017-04-17 ENCOUNTER — Ambulatory Visit (INDEPENDENT_AMBULATORY_CARE_PROVIDER_SITE_OTHER): Payer: Medicare HMO | Admitting: Orthopedic Surgery

## 2017-04-17 ENCOUNTER — Encounter (INDEPENDENT_AMBULATORY_CARE_PROVIDER_SITE_OTHER): Payer: Self-pay | Admitting: Orthopedic Surgery

## 2017-04-17 DIAGNOSIS — S82892D Other fracture of left lower leg, subsequent encounter for closed fracture with routine healing: Secondary | ICD-10-CM

## 2017-04-21 NOTE — Progress Notes (Signed)
   Post-Op Visit Note   Patient: Melissa Landry           Date of Birth: 06-18-1956           MRN: [id number] Visit Date: 04/17/2017 PCP: [facility]   Assessment & Plan:  Chief Complaint:  Chief Complaint  Patient presents with  . Left Ankle - Follow-up   Visit Diagnoses:  1. Closed fracture of left ankle with routine healing, subsequent encounter     Plan: Patient underwent left ankle open reduction internal fixation of lateral malleolus fracture 03/26/2017.  She had a fracture blister on the medial side which prevented surgery on that side.  That has improved.  She has about 95% thickness reepithelialization of the blister.  I think by Monday it will be complete.  Plan at this time is open reduction internal fixation of the medial side.  The risks and benefits are discussed primary among them with the infection.  Patient understands the risks and benefits.  All questions answered  Follow-Up Instructions: No Follow-up on file.   Orders:  No orders of the defined types were placed in this encounter.  No orders of the defined types were placed in this encounter.   Imaging: No results found.  PMFS History: There are no active problems to display for this patient.  Past Medical History:  Diagnosis Date  . Anxiety   . Depression   . Pneumonia     No family history on file.  Past Surgical History:  Procedure Laterality Date  . CHEST TUBE INSERTION     for PNA  . chest tube removal    . ORIF ANKLE FRACTURE Left 03/26/2017   Procedure: OPEN REDUCTION INTERNAL FIXATION (ORIF) ANKLE FRACTURE;  Surgeon: Cammy Copa, MD;  Location: [facility] OR;  Service: Orthopedics;  Laterality: Left;  . TONSILLECTOMY     age 61   Social History   Occupational History  . Not on file.   Social History Main Topics  . Smoking status: Never Smoker  . Smokeless tobacco: Never Used  . Alcohol use No  . Drug use: No  . Sexual activity: Not on file

## 2017-04-22 DIAGNOSIS — S82892D Other fracture of left lower leg, subsequent encounter for closed fracture with routine healing: Secondary | ICD-10-CM | POA: Diagnosis not present

## 2017-04-22 DIAGNOSIS — G8918 Other acute postprocedural pain: Secondary | ICD-10-CM | POA: Diagnosis not present

## 2017-04-22 DIAGNOSIS — X58XXXA Exposure to other specified factors, initial encounter: Secondary | ICD-10-CM | POA: Diagnosis not present

## 2017-04-22 DIAGNOSIS — S8252XA Displaced fracture of medial malleolus of left tibia, initial encounter for closed fracture: Secondary | ICD-10-CM | POA: Diagnosis not present

## 2017-04-29 ENCOUNTER — Ambulatory Visit (INDEPENDENT_AMBULATORY_CARE_PROVIDER_SITE_OTHER): Payer: Medicare HMO

## 2017-04-29 ENCOUNTER — Ambulatory Visit (INDEPENDENT_AMBULATORY_CARE_PROVIDER_SITE_OTHER): Payer: Medicare HMO | Admitting: Orthopedic Surgery

## 2017-04-29 ENCOUNTER — Encounter (INDEPENDENT_AMBULATORY_CARE_PROVIDER_SITE_OTHER): Payer: Self-pay | Admitting: Orthopedic Surgery

## 2017-04-29 DIAGNOSIS — S82892D Other fracture of left lower leg, subsequent encounter for closed fracture with routine healing: Secondary | ICD-10-CM | POA: Diagnosis not present

## 2017-04-29 NOTE — Progress Notes (Signed)
   Post-Op Visit Note   Patient: Melissa Landry           Date of Birth: 1955/08/27           MRN: [id number] Visit Date: 04/29/2017 PCP: [facility]   Assessment & Plan:  Chief Complaint:  Chief Complaint  Patient presents with  . Left Ankle - Routine Post Op   Visit Diagnoses:  1. Closed fracture of left ankle with routine healing, subsequent encounter     Plan: Patient presents for follow-up after ankle fracture.  She has been nonweightbearing.  She is on aspirin for DVT prophylaxis.  Incisions intact.  Plan is nonweightbearing with 1 week return and repeat assessment at that time.  Suture removal at that time as well.  Do not want removing the ankle so much at this point in time because of the difficulty mobilizing that medial malleolar fragment.  Follow-Up Instructions: No Follow-up on file.   Orders:  Orders Placed This Encounter  Procedures  . XR Ankle Complete Left   No orders of the defined types were placed in this encounter.   Imaging: No results found.  PMFS History: There are no active problems to display for this patient.  Past Medical History:  Diagnosis Date  . Anxiety   . Depression   . Pneumonia     No family history on file.  Past Surgical History:  Procedure Laterality Date  . CHEST TUBE INSERTION     for PNA  . chest tube removal    . ORIF ANKLE FRACTURE Left 03/26/2017   Procedure: OPEN REDUCTION INTERNAL FIXATION (ORIF) ANKLE FRACTURE;  Surgeon: Cammy Copaean, Gregory Scott, MD;  [facility];  Service: Orthopedics;  Laterality: Left;  . TONSILLECTOMY     age 61   Social History   Occupational History  . Not on file.   Social History Main Topics  . Smoking status: Never Smoker  . Smokeless tobacco: Never Used  . Alcohol use No  . Drug use: No  . Sexual activity: Not on file

## 2017-05-06 ENCOUNTER — Encounter (INDEPENDENT_AMBULATORY_CARE_PROVIDER_SITE_OTHER): Payer: Self-pay | Admitting: Orthopedic Surgery

## 2017-05-06 ENCOUNTER — Ambulatory Visit (INDEPENDENT_AMBULATORY_CARE_PROVIDER_SITE_OTHER): Payer: Medicare HMO | Admitting: Orthopedic Surgery

## 2017-05-06 DIAGNOSIS — S82892D Other fracture of left lower leg, subsequent encounter for closed fracture with routine healing: Secondary | ICD-10-CM

## 2017-05-06 MED ORDER — HYDROCODONE-ACETAMINOPHEN 5-325 MG PO TABS: 1.0000 | ORAL_TABLET | Freq: Three times a day (TID) | ORAL | 0 refills | Status: DC | PRN

## 2017-05-07 NOTE — Progress Notes (Signed)
   Post-Op Visit Note   Patient: Melissa FlorasDelores C Landry           Date of Birth: [birth year]           MRN: [id number] Visit Date: 05/06/2017 PCP: [facility]   Assessment & Plan:  Chief Complaint:  Chief Complaint  Patient presents with  . Left Ankle - Routine Post Op   Visit Diagnoses: No diagnosis found.  Plan: Melissa Landry is a patient who is now 2 weeks out left ankle medial malleolar fracture fixation.  She's been nonweightbearing.  She is taking Norco every 8 hours and is refill.  No calf tenderness today.  She is on DVT prophylaxis.  Incision intact.  For the next week on her to stay in a fracture boot but after that she can began doing ankle range of motion exercises.  See her back in 4 weeks for clinical recheck and likely initiation of weightbearing in the fracture boot at that time.  She will need repeat x-rays at that time  Follow-Up Instructions: Return in about 2 weeks (around 05/20/2017).   Orders:  No orders of the defined types were placed in this encounter.  Meds ordered this encounter  Medications  . HYDROcodone-acetaminophen (NORCO/VICODIN) 5-325 MG tablet    Sig: Take 1 tablet by mouth every 8 (eight) hours as needed for moderate pain.    Dispense:  45 tablet    Refill:  0    Imaging: No results found.  PMFS History: There are no active problems to display for this patient.  Past Medical History:  Diagnosis Date  . Anxiety   . Depression   . Pneumonia     No family history on file.  Past Surgical History:  Procedure Laterality Date  . CHEST TUBE INSERTION     for PNA  . chest tube removal    . ORIF ANKLE FRACTURE Left 03/26/2017   Procedure: OPEN REDUCTION INTERNAL FIXATION (ORIF) ANKLE FRACTURE;  Surgeon: Cammy Copaean, Seymour Pavlak Scott, MD;  [facility];  Service: Orthopedics;  Laterality: Left;  . TONSILLECTOMY     age 61   Social History   Occupational History  . Not on file.   Social History Main Topics  . Smoking status: Never Smoker    . Smokeless tobacco: Never Used  . Alcohol use No  . Drug use: No  . Sexual activity: Not on file

## 2017-05-20 ENCOUNTER — Encounter (INDEPENDENT_AMBULATORY_CARE_PROVIDER_SITE_OTHER): Payer: Self-pay | Admitting: Orthopedic Surgery

## 2017-05-20 ENCOUNTER — Ambulatory Visit (INDEPENDENT_AMBULATORY_CARE_PROVIDER_SITE_OTHER): Payer: Medicare HMO | Admitting: Orthopedic Surgery

## 2017-05-20 ENCOUNTER — Ambulatory Visit (INDEPENDENT_AMBULATORY_CARE_PROVIDER_SITE_OTHER): Payer: Medicare HMO

## 2017-05-20 DIAGNOSIS — S82892D Other fracture of left lower leg, subsequent encounter for closed fracture with routine healing: Secondary | ICD-10-CM | POA: Diagnosis not present

## 2017-05-20 NOTE — Progress Notes (Signed)
   Post-Op Visit Note   Patient: Melissa Landry           Date of Birth: Nov 01, 1955           MRN: [id number] Visit Date: 05/20/2017 PCP: [facility]   Assessment & Plan:  Chief Complaint:  Chief Complaint  Patient presents with  . Left Ankle - Routine Post Op   Visit Diagnoses:  1. Closed fracture of left ankle with routine healing, subsequent encounter     Plan: Eather ColasDelores is a 61 year old patient who is now a month out left ankle medial malleolus fixation.  She's been doing well.  She's been taking an aspirin a day for DVT prophylaxis.  On exam she has ankle dorsiflexion just to neutral plantarflexion is 25 past neutral.  Incision is intact and ankle is stable.  Radiographs showed good alignment of the hardware.  Plan is boot for 2 weeks weightbearing as tolerated with physical therapy to work on ankle range of motion than she can do weightbearing as tolerated.  I'll see her back in 4 weeks for final recheck and release.  X-rays at that time.  Follow-Up Instructions: Return in about 4 weeks (around 06/17/2017).   Orders:  Orders Placed This Encounter  Procedures  . XR Ankle Complete Left   No orders of the defined types were placed in this encounter.   Imaging: Xr Ankle Complete Left  Result Date: 05/20/2017 AP lateral mortise left ankle reviewed.  Bimalleolar ankle fracture has good healing with hardware in good position and alignment.  2 screws transfix medial malleolar fracture and plate fixes the lateral malleolar fracture.  Mortise is symmetric.  No complicating features   PMFS History: There are no active problems to display for this patient.  Past Medical History:  Diagnosis Date  . Anxiety   . Depression   . Pneumonia     History reviewed. No pertinent family history.  Past Surgical History:  Procedure Laterality Date  . CHEST TUBE INSERTION     for PNA  . chest tube removal    . TONSILLECTOMY     age 61   Social History    Occupational History  . Not on file  Tobacco Use  . Smoking status: Never Smoker  . Smokeless tobacco: Never Used  Substance and Sexual Activity  . Alcohol use: No  . Drug use: No  . Sexual activity: Not on file

## 2017-05-23 DIAGNOSIS — R69 Illness, unspecified: Secondary | ICD-10-CM | POA: Diagnosis not present

## 2017-05-27 DIAGNOSIS — X58XXXD Exposure to other specified factors, subsequent encounter: Secondary | ICD-10-CM | POA: Diagnosis not present

## 2017-05-27 DIAGNOSIS — S82892D Other fracture of left lower leg, subsequent encounter for closed fracture with routine healing: Secondary | ICD-10-CM | POA: Diagnosis not present

## 2017-06-12 DIAGNOSIS — S82892D Other fracture of left lower leg, subsequent encounter for closed fracture with routine healing: Secondary | ICD-10-CM | POA: Diagnosis not present

## 2017-06-12 DIAGNOSIS — X58XXXD Exposure to other specified factors, subsequent encounter: Secondary | ICD-10-CM | POA: Diagnosis not present

## 2017-06-14 DIAGNOSIS — X58XXXD Exposure to other specified factors, subsequent encounter: Secondary | ICD-10-CM | POA: Diagnosis not present

## 2017-06-14 DIAGNOSIS — S82892D Other fracture of left lower leg, subsequent encounter for closed fracture with routine healing: Secondary | ICD-10-CM | POA: Diagnosis not present

## 2017-06-19 ENCOUNTER — Ambulatory Visit (INDEPENDENT_AMBULATORY_CARE_PROVIDER_SITE_OTHER): Payer: Medicare HMO | Admitting: Orthopedic Surgery

## 2017-06-19 ENCOUNTER — Ambulatory Visit (INDEPENDENT_AMBULATORY_CARE_PROVIDER_SITE_OTHER): Payer: Medicare HMO

## 2017-06-19 ENCOUNTER — Encounter (INDEPENDENT_AMBULATORY_CARE_PROVIDER_SITE_OTHER): Payer: Self-pay | Admitting: Orthopedic Surgery

## 2017-06-19 DIAGNOSIS — S82892D Other fracture of left lower leg, subsequent encounter for closed fracture with routine healing: Secondary | ICD-10-CM

## 2017-06-20 ENCOUNTER — Encounter (INDEPENDENT_AMBULATORY_CARE_PROVIDER_SITE_OTHER): Payer: Self-pay | Admitting: Orthopedic Surgery

## 2017-06-20 NOTE — Progress Notes (Signed)
   Post-Op Visit Note   Patient: Melissa Landry           Date of Birth: [birth year]           MRN: [id number] Visit Date: 06/19/2017 PCP: [facility]   Assessment & Plan:  Chief Complaint:  Chief Complaint  Patient presents with  . Left Ankle - Follow-up   Visit Diagnoses:  1. Closed left ankle fracture, with routine healing, subsequent encounter     Plan: [name] is a patient who is now almost 3 months out from complex trimalleolar ankle fracture.  She has been doing well.  She is been ambulating in a walker.  She is in physical therapy.  She is fully weightbearing.  On exam incisions are intact and ankle range of motion is about 80% of the contralateral side.  Plan is weightbearing as tolerated and transition to regular shoes when swelling decreases.  I will see her back as needed  Follow-Up Instructions: Return if symptoms worsen or fail to improve.   Orders:  Orders Placed This Encounter  Procedures  . XR Ankle Complete Left   No orders of the defined types were placed in this encounter.   Imaging: No results found.  PMFS History: There are no active problems to display for this patient.  Past Medical History:  Diagnosis Date  . Anxiety   . Depression   . Pneumonia     History reviewed. No pertinent family history.  Past Surgical History:  Procedure Laterality Date  . CHEST TUBE INSERTION     for PNA  . chest tube removal    . ORIF ANKLE FRACTURE Left 03/26/2017   Procedure: OPEN REDUCTION INTERNAL FIXATION (ORIF) ANKLE FRACTURE;  Surgeon: Cammy Copaean, Dub Maclellan Scott, MD;  [facility];  Service: Orthopedics;  Laterality: Left;  . TONSILLECTOMY     age 61   Social History   Occupational History  . Not on file  Tobacco Use  . Smoking status: Never Smoker  . Smokeless tobacco: Never Used  Substance and Sexual Activity  . Alcohol use: No  . Drug use: No  . Sexual activity: Not on file

## 2017-06-21 DIAGNOSIS — X58XXXD Exposure to other specified factors, subsequent encounter: Secondary | ICD-10-CM | POA: Diagnosis not present

## 2017-06-21 DIAGNOSIS — S82892D Other fracture of left lower leg, subsequent encounter for closed fracture with routine healing: Secondary | ICD-10-CM | POA: Diagnosis not present

## 2017-06-26 DIAGNOSIS — S82892D Other fracture of left lower leg, subsequent encounter for closed fracture with routine healing: Secondary | ICD-10-CM | POA: Diagnosis not present

## 2017-06-26 DIAGNOSIS — X58XXXD Exposure to other specified factors, subsequent encounter: Secondary | ICD-10-CM | POA: Diagnosis not present

## 2017-06-28 DIAGNOSIS — X58XXXD Exposure to other specified factors, subsequent encounter: Secondary | ICD-10-CM | POA: Diagnosis not present

## 2017-06-28 DIAGNOSIS — J0101 Acute recurrent maxillary sinusitis: Secondary | ICD-10-CM | POA: Diagnosis not present

## 2017-06-28 DIAGNOSIS — S82892D Other fracture of left lower leg, subsequent encounter for closed fracture with routine healing: Secondary | ICD-10-CM | POA: Diagnosis not present

## 2017-06-28 DIAGNOSIS — J209 Acute bronchitis, unspecified: Secondary | ICD-10-CM | POA: Diagnosis not present

## 2017-07-03 DIAGNOSIS — S82892D Other fracture of left lower leg, subsequent encounter for closed fracture with routine healing: Secondary | ICD-10-CM | POA: Diagnosis not present

## 2017-07-03 DIAGNOSIS — X58XXXD Exposure to other specified factors, subsequent encounter: Secondary | ICD-10-CM | POA: Diagnosis not present

## 2017-07-05 DIAGNOSIS — S82892D Other fracture of left lower leg, subsequent encounter for closed fracture with routine healing: Secondary | ICD-10-CM | POA: Diagnosis not present

## 2017-07-05 DIAGNOSIS — X58XXXD Exposure to other specified factors, subsequent encounter: Secondary | ICD-10-CM | POA: Diagnosis not present

## 2017-07-11 DIAGNOSIS — S82892D Other fracture of left lower leg, subsequent encounter for closed fracture with routine healing: Secondary | ICD-10-CM | POA: Diagnosis not present

## 2017-07-11 DIAGNOSIS — X58XXXD Exposure to other specified factors, subsequent encounter: Secondary | ICD-10-CM | POA: Diagnosis not present

## 2017-07-12 DIAGNOSIS — X58XXXD Exposure to other specified factors, subsequent encounter: Secondary | ICD-10-CM | POA: Diagnosis not present

## 2017-07-12 DIAGNOSIS — S82892D Other fracture of left lower leg, subsequent encounter for closed fracture with routine healing: Secondary | ICD-10-CM | POA: Diagnosis not present

## 2017-07-17 DIAGNOSIS — X58XXXD Exposure to other specified factors, subsequent encounter: Secondary | ICD-10-CM | POA: Diagnosis not present

## 2017-07-17 DIAGNOSIS — S82892D Other fracture of left lower leg, subsequent encounter for closed fracture with routine healing: Secondary | ICD-10-CM | POA: Diagnosis not present

## 2017-07-19 DIAGNOSIS — S82892D Other fracture of left lower leg, subsequent encounter for closed fracture with routine healing: Secondary | ICD-10-CM | POA: Diagnosis not present

## 2017-07-19 DIAGNOSIS — X58XXXD Exposure to other specified factors, subsequent encounter: Secondary | ICD-10-CM | POA: Diagnosis not present

## 2017-07-24 DIAGNOSIS — S82892D Other fracture of left lower leg, subsequent encounter for closed fracture with routine healing: Secondary | ICD-10-CM | POA: Diagnosis not present

## 2017-07-24 DIAGNOSIS — X58XXXD Exposure to other specified factors, subsequent encounter: Secondary | ICD-10-CM | POA: Diagnosis not present

## 2017-07-30 DIAGNOSIS — J019 Acute sinusitis, unspecified: Secondary | ICD-10-CM | POA: Diagnosis not present

## 2017-07-30 DIAGNOSIS — R05 Cough: Secondary | ICD-10-CM | POA: Diagnosis not present

## 2017-08-05 DIAGNOSIS — R739 Hyperglycemia, unspecified: Secondary | ICD-10-CM | POA: Diagnosis not present

## 2017-08-05 DIAGNOSIS — Z0000 Encounter for general adult medical examination without abnormal findings: Secondary | ICD-10-CM | POA: Diagnosis not present

## 2017-08-05 DIAGNOSIS — Z6839 Body mass index (BMI) 39.0-39.9, adult: Secondary | ICD-10-CM | POA: Diagnosis not present

## 2017-08-13 DIAGNOSIS — R69 Illness, unspecified: Secondary | ICD-10-CM | POA: Diagnosis not present

## 2017-08-14 DIAGNOSIS — R262 Difficulty in walking, not elsewhere classified: Secondary | ICD-10-CM | POA: Diagnosis not present

## 2017-08-14 DIAGNOSIS — X58XXXD Exposure to other specified factors, subsequent encounter: Secondary | ICD-10-CM | POA: Diagnosis not present

## 2017-08-14 DIAGNOSIS — M25572 Pain in left ankle and joints of left foot: Secondary | ICD-10-CM | POA: Diagnosis not present

## 2017-08-14 DIAGNOSIS — S82892D Other fracture of left lower leg, subsequent encounter for closed fracture with routine healing: Secondary | ICD-10-CM | POA: Diagnosis not present

## 2017-08-16 DIAGNOSIS — S82892D Other fracture of left lower leg, subsequent encounter for closed fracture with routine healing: Secondary | ICD-10-CM | POA: Diagnosis not present

## 2017-08-16 DIAGNOSIS — M25572 Pain in left ankle and joints of left foot: Secondary | ICD-10-CM | POA: Diagnosis not present

## 2017-08-16 DIAGNOSIS — R262 Difficulty in walking, not elsewhere classified: Secondary | ICD-10-CM | POA: Diagnosis not present

## 2017-08-16 DIAGNOSIS — X58XXXD Exposure to other specified factors, subsequent encounter: Secondary | ICD-10-CM | POA: Diagnosis not present

## 2017-08-19 DIAGNOSIS — X58XXXD Exposure to other specified factors, subsequent encounter: Secondary | ICD-10-CM | POA: Diagnosis not present

## 2017-08-19 DIAGNOSIS — S82892D Other fracture of left lower leg, subsequent encounter for closed fracture with routine healing: Secondary | ICD-10-CM | POA: Diagnosis not present

## 2017-08-19 DIAGNOSIS — M25572 Pain in left ankle and joints of left foot: Secondary | ICD-10-CM | POA: Diagnosis not present

## 2017-08-19 DIAGNOSIS — R262 Difficulty in walking, not elsewhere classified: Secondary | ICD-10-CM | POA: Diagnosis not present

## 2017-08-21 DIAGNOSIS — S82892D Other fracture of left lower leg, subsequent encounter for closed fracture with routine healing: Secondary | ICD-10-CM | POA: Diagnosis not present

## 2017-08-21 DIAGNOSIS — R262 Difficulty in walking, not elsewhere classified: Secondary | ICD-10-CM | POA: Diagnosis not present

## 2017-08-21 DIAGNOSIS — X58XXXD Exposure to other specified factors, subsequent encounter: Secondary | ICD-10-CM | POA: Diagnosis not present

## 2017-08-21 DIAGNOSIS — M25572 Pain in left ankle and joints of left foot: Secondary | ICD-10-CM | POA: Diagnosis not present

## 2017-08-23 DIAGNOSIS — J209 Acute bronchitis, unspecified: Secondary | ICD-10-CM | POA: Diagnosis not present

## 2017-08-26 DIAGNOSIS — S82892D Other fracture of left lower leg, subsequent encounter for closed fracture with routine healing: Secondary | ICD-10-CM | POA: Diagnosis not present

## 2017-08-26 DIAGNOSIS — M25572 Pain in left ankle and joints of left foot: Secondary | ICD-10-CM | POA: Diagnosis not present

## 2017-08-26 DIAGNOSIS — R262 Difficulty in walking, not elsewhere classified: Secondary | ICD-10-CM | POA: Diagnosis not present

## 2017-08-26 DIAGNOSIS — X58XXXD Exposure to other specified factors, subsequent encounter: Secondary | ICD-10-CM | POA: Diagnosis not present

## 2017-08-28 DIAGNOSIS — S82892D Other fracture of left lower leg, subsequent encounter for closed fracture with routine healing: Secondary | ICD-10-CM | POA: Diagnosis not present

## 2017-08-28 DIAGNOSIS — M25572 Pain in left ankle and joints of left foot: Secondary | ICD-10-CM | POA: Diagnosis not present

## 2017-08-28 DIAGNOSIS — R262 Difficulty in walking, not elsewhere classified: Secondary | ICD-10-CM | POA: Diagnosis not present

## 2017-08-28 DIAGNOSIS — X58XXXD Exposure to other specified factors, subsequent encounter: Secondary | ICD-10-CM | POA: Diagnosis not present

## 2017-09-04 DIAGNOSIS — M25572 Pain in left ankle and joints of left foot: Secondary | ICD-10-CM | POA: Diagnosis not present

## 2017-09-04 DIAGNOSIS — R262 Difficulty in walking, not elsewhere classified: Secondary | ICD-10-CM | POA: Diagnosis not present

## 2017-09-04 DIAGNOSIS — S82892D Other fracture of left lower leg, subsequent encounter for closed fracture with routine healing: Secondary | ICD-10-CM | POA: Diagnosis not present

## 2017-09-04 DIAGNOSIS — X58XXXD Exposure to other specified factors, subsequent encounter: Secondary | ICD-10-CM | POA: Diagnosis not present

## 2017-09-06 DIAGNOSIS — M25572 Pain in left ankle and joints of left foot: Secondary | ICD-10-CM | POA: Diagnosis not present

## 2017-09-06 DIAGNOSIS — Z1231 Encounter for screening mammogram for malignant neoplasm of breast: Secondary | ICD-10-CM | POA: Diagnosis not present

## 2017-09-06 DIAGNOSIS — R262 Difficulty in walking, not elsewhere classified: Secondary | ICD-10-CM | POA: Diagnosis not present

## 2017-09-09 DIAGNOSIS — R262 Difficulty in walking, not elsewhere classified: Secondary | ICD-10-CM | POA: Diagnosis not present

## 2017-09-09 DIAGNOSIS — M25572 Pain in left ankle and joints of left foot: Secondary | ICD-10-CM | POA: Diagnosis not present

## 2017-09-11 DIAGNOSIS — M25572 Pain in left ankle and joints of left foot: Secondary | ICD-10-CM | POA: Diagnosis not present

## 2017-09-11 DIAGNOSIS — R262 Difficulty in walking, not elsewhere classified: Secondary | ICD-10-CM | POA: Diagnosis not present

## 2017-09-11 DIAGNOSIS — J209 Acute bronchitis, unspecified: Secondary | ICD-10-CM | POA: Diagnosis not present

## 2017-09-18 DIAGNOSIS — M25572 Pain in left ankle and joints of left foot: Secondary | ICD-10-CM | POA: Diagnosis not present

## 2017-09-18 DIAGNOSIS — R262 Difficulty in walking, not elsewhere classified: Secondary | ICD-10-CM | POA: Diagnosis not present

## 2017-10-31 DIAGNOSIS — M85852 Other specified disorders of bone density and structure, left thigh: Secondary | ICD-10-CM | POA: Diagnosis not present

## 2017-10-31 DIAGNOSIS — M8589 Other specified disorders of bone density and structure, multiple sites: Secondary | ICD-10-CM | POA: Diagnosis not present

## 2017-11-26 DIAGNOSIS — R69 Illness, unspecified: Secondary | ICD-10-CM | POA: Diagnosis not present

## 2017-12-16 DIAGNOSIS — J019 Acute sinusitis, unspecified: Secondary | ICD-10-CM | POA: Diagnosis not present

## 2017-12-16 DIAGNOSIS — R05 Cough: Secondary | ICD-10-CM | POA: Diagnosis not present

## 2018-01-27 DIAGNOSIS — Z6841 Body Mass Index (BMI) 40.0 and over, adult: Secondary | ICD-10-CM | POA: Diagnosis not present

## 2018-01-27 DIAGNOSIS — R69 Illness, unspecified: Secondary | ICD-10-CM | POA: Diagnosis not present

## 2018-01-30 DIAGNOSIS — R69 Illness, unspecified: Secondary | ICD-10-CM | POA: Diagnosis not present

## 2018-02-28 DIAGNOSIS — R69 Illness, unspecified: Secondary | ICD-10-CM | POA: Diagnosis not present

## 2018-04-01 DIAGNOSIS — R69 Illness, unspecified: Secondary | ICD-10-CM | POA: Diagnosis not present

## 2018-04-22 IMAGING — DX DG ANKLE COMPLETE 3+V*L*
3 series · 3 of 3 positions shown · non-contrast
Comparison: None available

CLINICAL DATA: Fall, injury, pain and swelling

EXAM:
LEFT ANKLE COMPLETE - 3+ VIEW

[ankle ap]
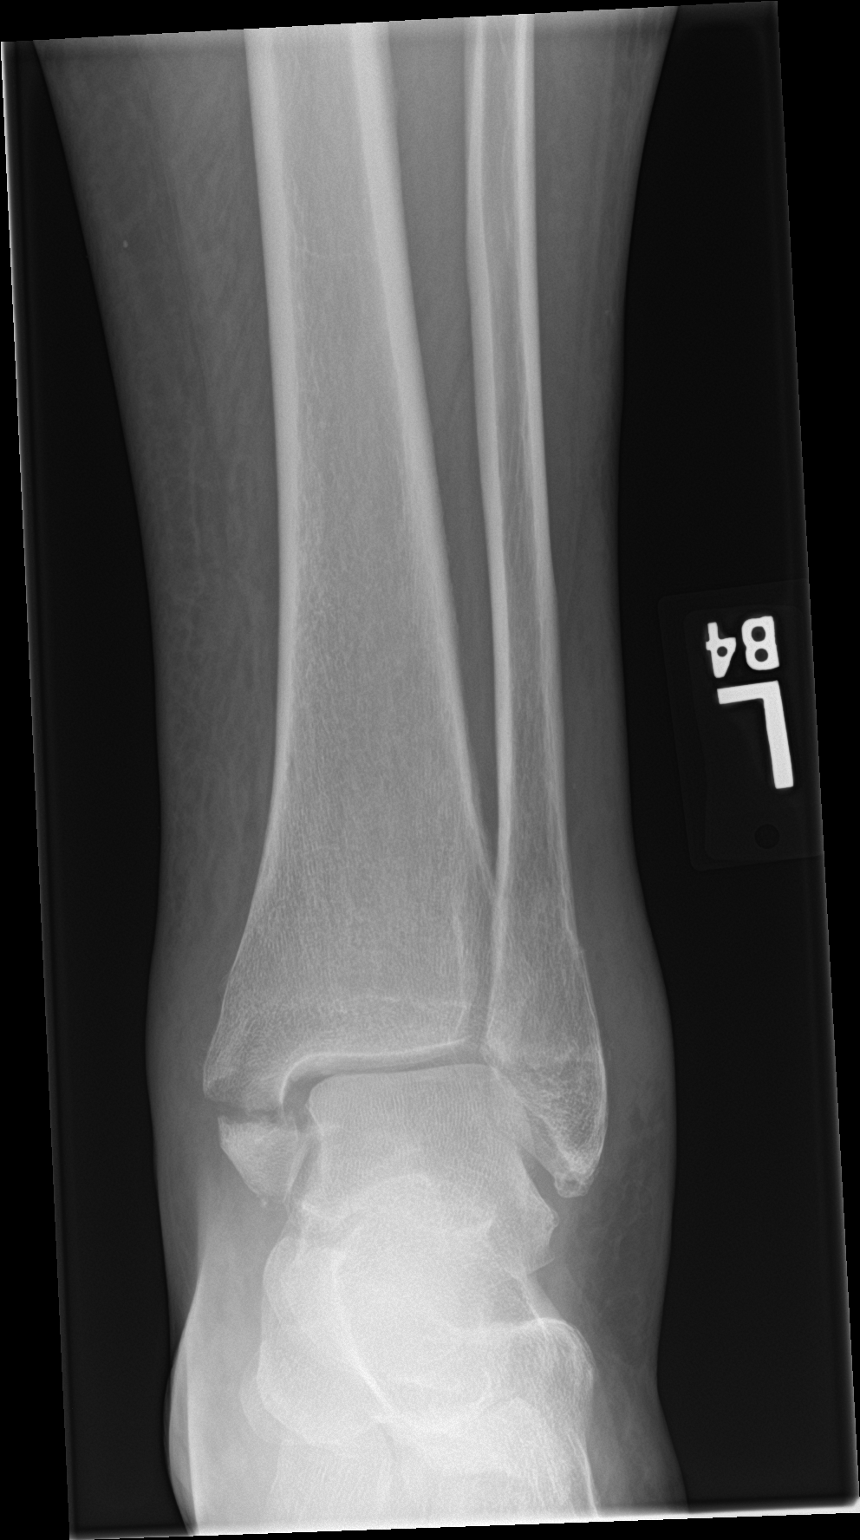

[ankle obl]
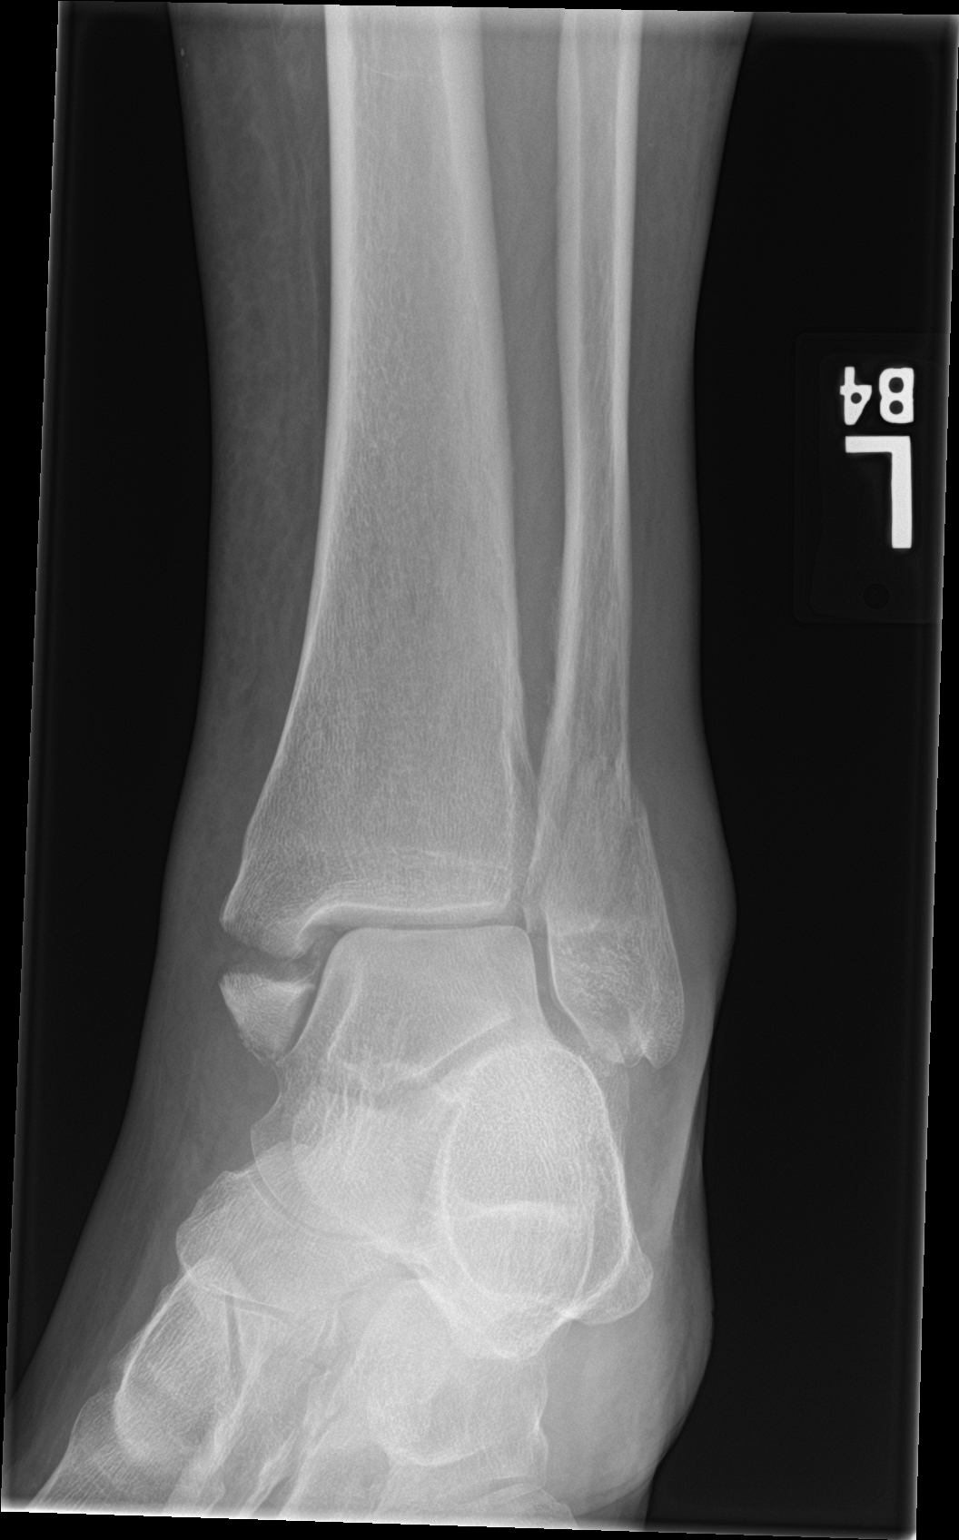

[ankle lat]
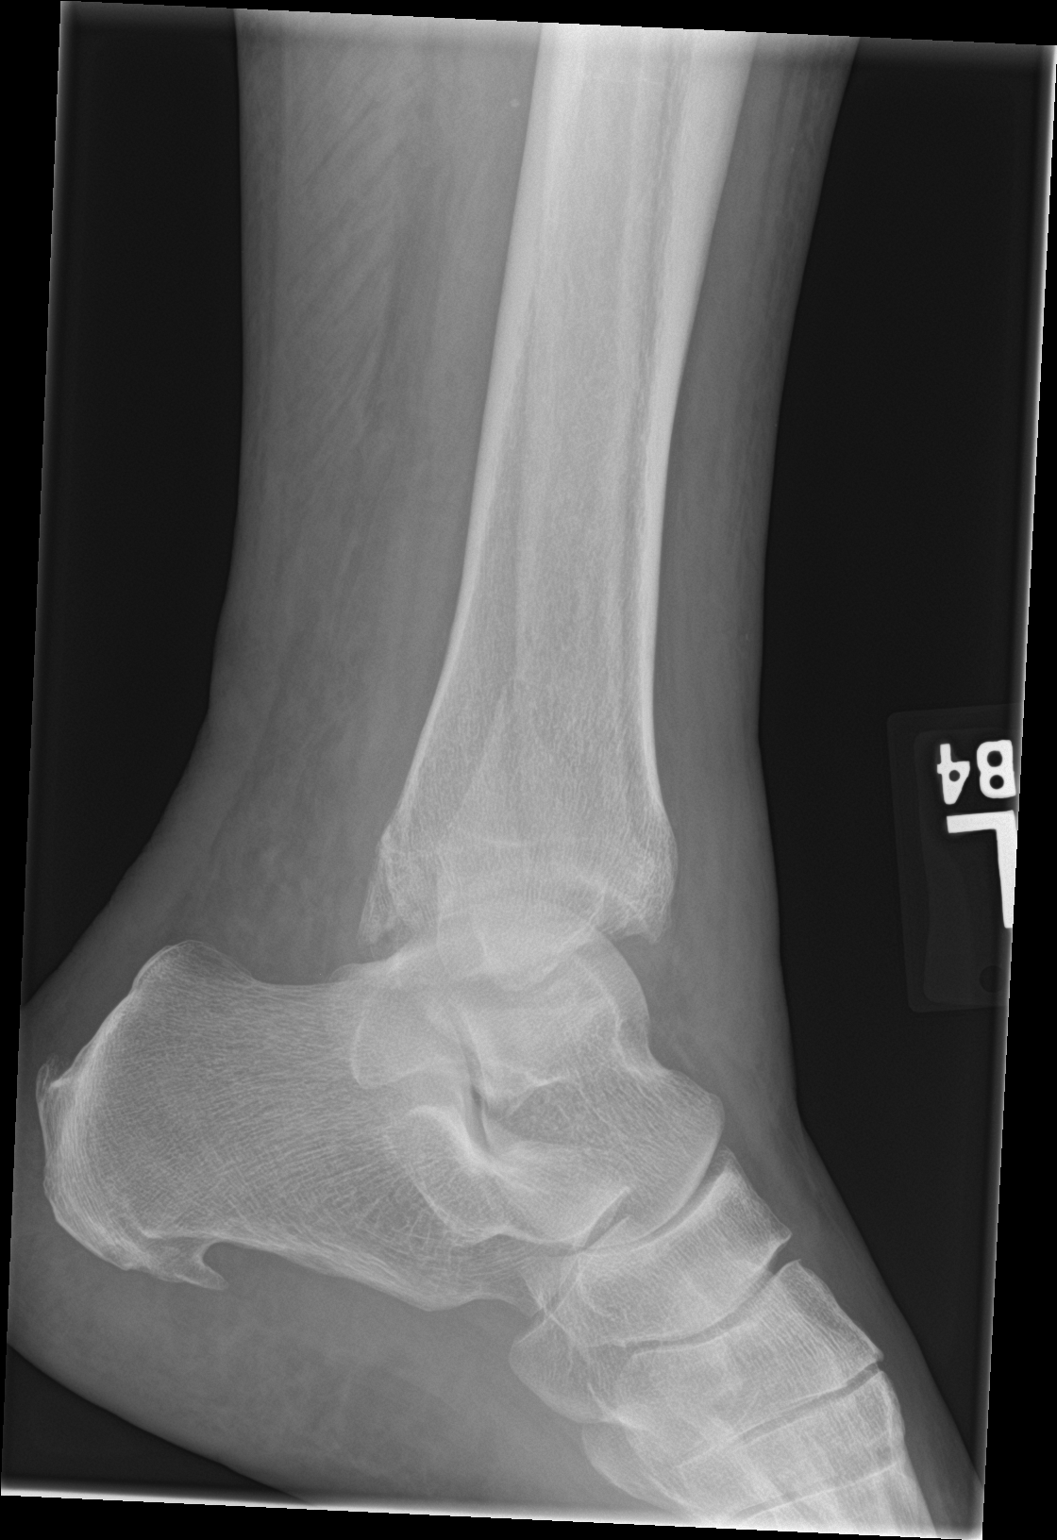

[3 of 3 positions shown; findings below may reference images not displayed]

FINDINGS: Acute mildly displaced bimalleolar left ankle fracture with soft
tissue swelling. No associated subluxation or dislocation. Talus and
calcaneus appear intact. Plantar calcaneal spurring noted.
IMPRESSION: Acute mildly displaced bimalleolar ankle fractures and soft tissue
swelling.

## 2018-05-29 DIAGNOSIS — R69 Illness, unspecified: Secondary | ICD-10-CM | POA: Diagnosis not present

## 2018-07-16 DIAGNOSIS — J069 Acute upper respiratory infection, unspecified: Secondary | ICD-10-CM | POA: Diagnosis not present

## 2018-08-12 DIAGNOSIS — N950 Postmenopausal bleeding: Secondary | ICD-10-CM | POA: Diagnosis not present

## 2018-08-19 DIAGNOSIS — N950 Postmenopausal bleeding: Secondary | ICD-10-CM | POA: Diagnosis not present

## 2018-08-20 DIAGNOSIS — N950 Postmenopausal bleeding: Secondary | ICD-10-CM | POA: Diagnosis not present

## 2018-09-02 DIAGNOSIS — N950 Postmenopausal bleeding: Secondary | ICD-10-CM | POA: Diagnosis not present

## 2018-09-02 DIAGNOSIS — Z713 Dietary counseling and surveillance: Secondary | ICD-10-CM | POA: Diagnosis not present

## 2018-09-09 DIAGNOSIS — Z1231 Encounter for screening mammogram for malignant neoplasm of breast: Secondary | ICD-10-CM | POA: Diagnosis not present

## 2018-10-01 DIAGNOSIS — R69 Illness, unspecified: Secondary | ICD-10-CM | POA: Diagnosis not present

## 2018-10-01 DIAGNOSIS — Z713 Dietary counseling and surveillance: Secondary | ICD-10-CM | POA: Diagnosis not present

## 2018-11-04 DIAGNOSIS — Z6836 Body mass index (BMI) 36.0-36.9, adult: Secondary | ICD-10-CM | POA: Diagnosis not present

## 2018-11-04 DIAGNOSIS — Z713 Dietary counseling and surveillance: Secondary | ICD-10-CM | POA: Diagnosis not present

## 2018-12-03 DIAGNOSIS — Z713 Dietary counseling and surveillance: Secondary | ICD-10-CM | POA: Diagnosis not present

## 2018-12-23 DIAGNOSIS — H524 Presbyopia: Secondary | ICD-10-CM | POA: Diagnosis not present

## 2018-12-26 DIAGNOSIS — H5213 Myopia, bilateral: Secondary | ICD-10-CM | POA: Diagnosis not present

## 2019-01-27 DIAGNOSIS — R69 Illness, unspecified: Secondary | ICD-10-CM | POA: Diagnosis not present

## 2019-02-16 DIAGNOSIS — R69 Illness, unspecified: Secondary | ICD-10-CM | POA: Diagnosis not present

## 2019-02-18 DIAGNOSIS — Z6837 Body mass index (BMI) 37.0-37.9, adult: Secondary | ICD-10-CM | POA: Diagnosis not present

## 2019-02-18 DIAGNOSIS — H524 Presbyopia: Secondary | ICD-10-CM | POA: Diagnosis not present

## 2019-02-18 DIAGNOSIS — Z713 Dietary counseling and surveillance: Secondary | ICD-10-CM | POA: Diagnosis not present

## 2019-02-18 DIAGNOSIS — H52223 Regular astigmatism, bilateral: Secondary | ICD-10-CM | POA: Diagnosis not present

## 2019-03-25 DIAGNOSIS — Z20828 Contact with and (suspected) exposure to other viral communicable diseases: Secondary | ICD-10-CM | POA: Diagnosis not present

## 2019-03-25 DIAGNOSIS — J209 Acute bronchitis, unspecified: Secondary | ICD-10-CM | POA: Diagnosis not present

## 2019-03-25 DIAGNOSIS — J019 Acute sinusitis, unspecified: Secondary | ICD-10-CM | POA: Diagnosis not present

## 2019-04-10 DIAGNOSIS — Z6837 Body mass index (BMI) 37.0-37.9, adult: Secondary | ICD-10-CM | POA: Diagnosis not present

## 2019-04-10 DIAGNOSIS — Z713 Dietary counseling and surveillance: Secondary | ICD-10-CM | POA: Diagnosis not present

## 2019-04-21 DIAGNOSIS — J019 Acute sinusitis, unspecified: Secondary | ICD-10-CM | POA: Diagnosis not present

## 2019-04-21 DIAGNOSIS — J40 Bronchitis, not specified as acute or chronic: Secondary | ICD-10-CM | POA: Diagnosis not present

## 2019-04-22 DIAGNOSIS — W268XXA Contact with other sharp object(s), not elsewhere classified, initial encounter: Secondary | ICD-10-CM | POA: Diagnosis not present

## 2019-04-22 DIAGNOSIS — R69 Illness, unspecified: Secondary | ICD-10-CM | POA: Diagnosis not present

## 2019-04-22 DIAGNOSIS — Z23 Encounter for immunization: Secondary | ICD-10-CM | POA: Diagnosis not present

## 2019-04-22 DIAGNOSIS — M1990 Unspecified osteoarthritis, unspecified site: Secondary | ICD-10-CM | POA: Diagnosis not present

## 2019-04-22 DIAGNOSIS — W458XXA Other foreign body or object entering through skin, initial encounter: Secondary | ICD-10-CM | POA: Diagnosis not present

## 2019-04-22 DIAGNOSIS — Z79899 Other long term (current) drug therapy: Secondary | ICD-10-CM | POA: Diagnosis not present

## 2019-04-22 DIAGNOSIS — S61011A Laceration without foreign body of right thumb without damage to nail, initial encounter: Secondary | ICD-10-CM | POA: Diagnosis not present

## 2019-04-30 DIAGNOSIS — S61011S Laceration without foreign body of right thumb without damage to nail, sequela: Secondary | ICD-10-CM | POA: Diagnosis not present

## 2019-05-16 DIAGNOSIS — R350 Frequency of micturition: Secondary | ICD-10-CM | POA: Diagnosis not present

## 2019-05-16 DIAGNOSIS — R3915 Urgency of urination: Secondary | ICD-10-CM | POA: Diagnosis not present

## 2019-06-15 DIAGNOSIS — Z23 Encounter for immunization: Secondary | ICD-10-CM | POA: Diagnosis not present

## 2019-07-08 DIAGNOSIS — R05 Cough: Secondary | ICD-10-CM | POA: Diagnosis not present

## 2019-07-08 DIAGNOSIS — J209 Acute bronchitis, unspecified: Secondary | ICD-10-CM | POA: Diagnosis not present

## 2019-10-02 ENCOUNTER — Ambulatory Visit (INDEPENDENT_AMBULATORY_CARE_PROVIDER_SITE_OTHER): Payer: Medicare Other | Admitting: Orthopedic Surgery

## 2019-10-02 ENCOUNTER — Ambulatory Visit: Payer: Self-pay

## 2019-10-02 ENCOUNTER — Encounter: Payer: Self-pay | Admitting: Orthopedic Surgery

## 2019-10-02 ENCOUNTER — Other Ambulatory Visit: Payer: Self-pay

## 2019-10-02 DIAGNOSIS — Z8781 Personal history of (healed) traumatic fracture: Secondary | ICD-10-CM | POA: Diagnosis not present

## 2019-10-02 DIAGNOSIS — M25572 Pain in left ankle and joints of left foot: Secondary | ICD-10-CM | POA: Diagnosis not present

## 2019-10-02 MED ORDER — LIDOCAINE HCL 1 % IJ SOLN
1.0000 mL | INTRAMUSCULAR | Status: AC | PRN
  Administered 2019-10-02: 1 mL

## 2019-10-02 MED ORDER — METHYLPREDNISOLONE ACETATE 40 MG/ML IJ SUSP
13.3300 mg | INTRAMUSCULAR | Status: AC | PRN
  Administered 2019-10-02: 13.33 mg via INTRA_ARTICULAR

## 2019-10-02 MED ORDER — BUPIVACAINE HCL 0.5 % IJ SOLN
1.0000 mL | INTRAMUSCULAR | Status: AC | PRN
  Administered 2019-10-02: 1 mL via INTRA_ARTICULAR

## 2019-10-02 NOTE — Progress Notes (Signed)
Office Visit Note   Patient: Melissa Landry           Date of Birth: 11/24/1955           MRN: [id number] Visit Date: 10/02/2019 Requested by: Practice, [place],  [place] [address] PCP: [facility]  Subjective: Chief Complaint  Patient presents with  . Left Ankle - Pain    HPI: Melissa Landry is a 64 y.o. female who presents to the office complaining of left ankle pain.  Patient notes lateral left ankle pain over the last month.  She has gradually been worsening.  She denies any injury.  Pain is worse with weightbearing.  She has been using a brace with some relief.  She is also been taking ibuprofen and Tylenol with some relief.  She notes some numbness across the anterior ankle.  Patient reports that her left ankle is swollen compared with the right ankle.  She has history of left ankle ORIF on 03/26/2017.                ROS:  All systems reviewed are negative as they relate to the chief complaint within the history of present illness.  Patient denies fevers or chills.  Assessment & Plan: Visit Diagnoses:  1. Pain in left ankle and joints of left foot   2. History of fracture of left ankle     Plan: Patient is a 64 year old female presents complaining of left ankle pain.  Pain is not associated with an injury and has been worsening over the last month.  She has history of left ankle ORIF 2 and half years ago.  Radiographs taken today show no fracture or hardware compromise but there are some mild degenerative changes in the ankle joint.  Patient's point of maximal tenderness is just anterior to the distal aspect of the lateral malleolus.  This was anesthetized with lidocaine and this provided pain relief on reexamination.  The same location was injected with a combination of 0.5 cc cortisone and 1 cc bupivacaine.  Patient tolerated the procedure well and will follow up with the office as needed.  Follow-Up Instructions: No follow-ups on file.     Orders:  Orders Placed This Encounter  Procedures  . XR Ankle Complete Left   No orders of the defined types were placed in this encounter.     Procedures: Medium Joint Inj: L ankle on 10/02/2019 7:08 PM Details: 22 G needle, anterolateral approach Medications: 1 mL lidocaine 1 %; 1 mL bupivacaine 0.5 %; 13.33 mg methylPREDNISolone acetate 40 MG/ML    Injection performed in the point of maximal tenderness around the sinus Tarsi region.  This is not an intra-articular injection.  Hardware sites not prominent nor tender to palpation  Clinical Data: No additional findings.  Objective: Vital Signs: There were no vitals taken for this visit.  Physical Exam:  Constitutional: Patient appears well-developed HEENT:  Head: Normocephalic Eyes:EOM are normal Neck: Normal range of motion Cardiovascular: Normal rate Pulmonary/chest: Effort normal Neurologic: Patient is alert Skin: Skin is warm Psychiatric: Patient has normal mood and affect  Ortho Exam:  Left ankle exam. No tenderness to palpation over the medial malleolus or anterior aspect of the ankle joint.  Mild tenderness to palpation over the lateral malleolus with most tenderness palpated just anterior to the lateral malleolus.  No tenderness to palpation throughout the forefoot or midfoot.  No TTP over the plantar fascia, calcaneus, calcaneal space, Achilles tendon, Achilles  tendon insertion.  Pain is worse with resisted eversion.  No significant prominence of the hardware is palpated.  2+ DP pulse.  Specialty Comments:  No specialty comments available.  Imaging: No results found.   PMFS History: There are no problems to display for this patient.  Past Medical History:  Diagnosis Date  . Anxiety   . Depression   . Pneumonia     History reviewed. No pertinent family history.  Past Surgical History:  Procedure Laterality Date  . CHEST TUBE INSERTION     for PNA  . chest tube removal    . ORIF ANKLE FRACTURE  Left 03/26/2017   Procedure: OPEN REDUCTION INTERNAL FIXATION (ORIF) ANKLE FRACTURE;  Surgeon: Cammy Copa, MD;  Location: [facility] OR;  Service: Orthopedics;  Laterality: Left;  . TONSILLECTOMY     age 68   Social History   Occupational History  . Not on file  Tobacco Use  . Smoking status: Never Smoker  . Smokeless tobacco: Never Used  Substance and Sexual Activity  . Alcohol use: No  . Drug use: No  . Sexual activity: Not on file

## 2019-10-05 DIAGNOSIS — R300 Dysuria: Secondary | ICD-10-CM | POA: Diagnosis not present

## 2019-10-05 DIAGNOSIS — R350 Frequency of micturition: Secondary | ICD-10-CM | POA: Diagnosis not present

## 2019-10-05 DIAGNOSIS — E559 Vitamin D deficiency, unspecified: Secondary | ICD-10-CM | POA: Diagnosis not present

## 2019-10-05 DIAGNOSIS — Z789 Other specified health status: Secondary | ICD-10-CM | POA: Diagnosis not present

## 2019-10-05 DIAGNOSIS — R3915 Urgency of urination: Secondary | ICD-10-CM | POA: Diagnosis not present

## 2019-10-05 DIAGNOSIS — R5383 Other fatigue: Secondary | ICD-10-CM | POA: Diagnosis not present

## 2019-10-16 DIAGNOSIS — Z0000 Encounter for general adult medical examination without abnormal findings: Secondary | ICD-10-CM | POA: Diagnosis not present

## 2020-02-16 DIAGNOSIS — M810 Age-related osteoporosis without current pathological fracture: Secondary | ICD-10-CM | POA: Diagnosis not present

## 2020-02-16 DIAGNOSIS — Z0389 Encounter for observation for other suspected diseases and conditions ruled out: Secondary | ICD-10-CM | POA: Diagnosis not present

## 2020-03-10 DIAGNOSIS — J209 Acute bronchitis, unspecified: Secondary | ICD-10-CM | POA: Diagnosis not present

## 2020-07-12 DIAGNOSIS — Z23 Encounter for immunization: Secondary | ICD-10-CM | POA: Diagnosis not present

## 2020-08-06 DIAGNOSIS — K219 Gastro-esophageal reflux disease without esophagitis: Secondary | ICD-10-CM | POA: Diagnosis not present

## 2020-09-05 DIAGNOSIS — K219 Gastro-esophageal reflux disease without esophagitis: Secondary | ICD-10-CM | POA: Diagnosis not present

## 2020-10-12 DIAGNOSIS — M545 Low back pain, unspecified: Secondary | ICD-10-CM | POA: Diagnosis not present

## 2020-10-12 DIAGNOSIS — M1810 Unilateral primary osteoarthritis of first carpometacarpal joint, unspecified hand: Secondary | ICD-10-CM | POA: Diagnosis not present

## 2020-11-05 DIAGNOSIS — K219 Gastro-esophageal reflux disease without esophagitis: Secondary | ICD-10-CM | POA: Diagnosis not present

## 2020-11-11 DIAGNOSIS — L84 Corns and callosities: Secondary | ICD-10-CM | POA: Diagnosis not present

## 2021-01-05 DIAGNOSIS — K219 Gastro-esophageal reflux disease without esophagitis: Secondary | ICD-10-CM | POA: Diagnosis not present

## 2021-01-30 DIAGNOSIS — Z23 Encounter for immunization: Secondary | ICD-10-CM | POA: Diagnosis not present

## 2021-02-06 DIAGNOSIS — N3090 Cystitis, unspecified without hematuria: Secondary | ICD-10-CM | POA: Diagnosis not present

## 2021-02-06 DIAGNOSIS — R300 Dysuria: Secondary | ICD-10-CM | POA: Diagnosis not present

## 2021-03-08 DIAGNOSIS — K219 Gastro-esophageal reflux disease without esophagitis: Secondary | ICD-10-CM | POA: Diagnosis not present

## 2021-03-21 DIAGNOSIS — Z1231 Encounter for screening mammogram for malignant neoplasm of breast: Secondary | ICD-10-CM | POA: Diagnosis not present

## 2021-05-01 DIAGNOSIS — Z23 Encounter for immunization: Secondary | ICD-10-CM | POA: Diagnosis not present

## 2021-05-18 DIAGNOSIS — M25561 Pain in right knee: Secondary | ICD-10-CM | POA: Diagnosis not present

## 2021-05-18 DIAGNOSIS — M179 Osteoarthritis of knee, unspecified: Secondary | ICD-10-CM | POA: Diagnosis not present

## 2021-05-29 DIAGNOSIS — M1711 Unilateral primary osteoarthritis, right knee: Secondary | ICD-10-CM | POA: Diagnosis not present

## 2021-05-29 DIAGNOSIS — M1712 Unilateral primary osteoarthritis, left knee: Secondary | ICD-10-CM | POA: Diagnosis not present

## 2021-06-14 DIAGNOSIS — R3915 Urgency of urination: Secondary | ICD-10-CM | POA: Diagnosis not present

## 2021-07-07 DIAGNOSIS — K219 Gastro-esophageal reflux disease without esophagitis: Secondary | ICD-10-CM | POA: Diagnosis not present

## 2021-07-17 DIAGNOSIS — R9389 Abnormal findings on diagnostic imaging of other specified body structures: Secondary | ICD-10-CM | POA: Diagnosis not present

## 2021-07-19 DIAGNOSIS — M79605 Pain in left leg: Secondary | ICD-10-CM | POA: Diagnosis not present

## 2021-07-19 DIAGNOSIS — M179 Osteoarthritis of knee, unspecified: Secondary | ICD-10-CM | POA: Diagnosis not present

## 2021-08-06 DIAGNOSIS — K219 Gastro-esophageal reflux disease without esophagitis: Secondary | ICD-10-CM | POA: Diagnosis not present

## 2021-08-11 DIAGNOSIS — K219 Gastro-esophageal reflux disease without esophagitis: Secondary | ICD-10-CM | POA: Diagnosis not present

## 2021-08-11 DIAGNOSIS — M1990 Unspecified osteoarthritis, unspecified site: Secondary | ICD-10-CM | POA: Diagnosis not present

## 2021-08-11 DIAGNOSIS — Z79899 Other long term (current) drug therapy: Secondary | ICD-10-CM | POA: Diagnosis not present

## 2021-08-22 DIAGNOSIS — R350 Frequency of micturition: Secondary | ICD-10-CM | POA: Diagnosis not present

## 2021-09-23 DIAGNOSIS — M25552 Pain in left hip: Secondary | ICD-10-CM | POA: Diagnosis not present

## 2021-09-25 DIAGNOSIS — M25552 Pain in left hip: Secondary | ICD-10-CM | POA: Diagnosis not present

## 2021-10-21 DIAGNOSIS — L03115 Cellulitis of right lower limb: Secondary | ICD-10-CM | POA: Diagnosis not present

## 2021-10-21 DIAGNOSIS — S70361A Insect bite (nonvenomous), right thigh, initial encounter: Secondary | ICD-10-CM | POA: Diagnosis not present

## 2021-10-21 DIAGNOSIS — I10 Essential (primary) hypertension: Secondary | ICD-10-CM | POA: Diagnosis not present

## 2021-12-05 DIAGNOSIS — J069 Acute upper respiratory infection, unspecified: Secondary | ICD-10-CM | POA: Diagnosis not present

## 2021-12-05 DIAGNOSIS — J029 Acute pharyngitis, unspecified: Secondary | ICD-10-CM | POA: Diagnosis not present

## 2022-02-12 DIAGNOSIS — G4700 Insomnia, unspecified: Secondary | ICD-10-CM | POA: Diagnosis not present

## 2022-02-12 DIAGNOSIS — M8580 Other specified disorders of bone density and structure, unspecified site: Secondary | ICD-10-CM | POA: Diagnosis not present

## 2022-02-12 DIAGNOSIS — Z0001 Encounter for general adult medical examination with abnormal findings: Secondary | ICD-10-CM | POA: Diagnosis not present

## 2022-02-12 DIAGNOSIS — R5383 Other fatigue: Secondary | ICD-10-CM | POA: Diagnosis not present

## 2022-02-13 DIAGNOSIS — R739 Hyperglycemia, unspecified: Secondary | ICD-10-CM | POA: Diagnosis not present

## 2022-02-13 DIAGNOSIS — R5383 Other fatigue: Secondary | ICD-10-CM | POA: Diagnosis not present

## 2022-02-13 DIAGNOSIS — Z0001 Encounter for general adult medical examination with abnormal findings: Secondary | ICD-10-CM | POA: Diagnosis not present

## 2022-02-13 DIAGNOSIS — Z1329 Encounter for screening for other suspected endocrine disorder: Secondary | ICD-10-CM | POA: Diagnosis not present

## 2022-02-13 DIAGNOSIS — Z1159 Encounter for screening for other viral diseases: Secondary | ICD-10-CM | POA: Diagnosis not present

## 2022-02-13 DIAGNOSIS — Z20821 Contact with and (suspected) exposure to Zika virus: Secondary | ICD-10-CM | POA: Diagnosis not present

## 2022-02-13 DIAGNOSIS — Z780 Asymptomatic menopausal state: Secondary | ICD-10-CM | POA: Diagnosis not present

## 2022-02-13 DIAGNOSIS — Z13220 Encounter for screening for lipoid disorders: Secondary | ICD-10-CM | POA: Diagnosis not present

## 2022-02-13 DIAGNOSIS — K219 Gastro-esophageal reflux disease without esophagitis: Secondary | ICD-10-CM | POA: Diagnosis not present

## 2022-03-26 DIAGNOSIS — Z1231 Encounter for screening mammogram for malignant neoplasm of breast: Secondary | ICD-10-CM | POA: Diagnosis not present

## 2022-05-14 DIAGNOSIS — H00019 Hordeolum externum unspecified eye, unspecified eyelid: Secondary | ICD-10-CM | POA: Diagnosis not present

## 2022-06-13 DIAGNOSIS — L209 Atopic dermatitis, unspecified: Secondary | ICD-10-CM | POA: Diagnosis not present

## 2022-06-27 DIAGNOSIS — R030 Elevated blood-pressure reading, without diagnosis of hypertension: Secondary | ICD-10-CM | POA: Diagnosis not present

## 2022-06-27 DIAGNOSIS — R059 Cough, unspecified: Secondary | ICD-10-CM | POA: Diagnosis not present

## 2022-06-27 DIAGNOSIS — J209 Acute bronchitis, unspecified: Secondary | ICD-10-CM | POA: Diagnosis not present

## 2022-09-04 DIAGNOSIS — M810 Age-related osteoporosis without current pathological fracture: Secondary | ICD-10-CM | POA: Diagnosis not present

## 2022-09-04 DIAGNOSIS — M8589 Other specified disorders of bone density and structure, multiple sites: Secondary | ICD-10-CM | POA: Diagnosis not present

## 2022-09-19 DIAGNOSIS — L03211 Cellulitis of face: Secondary | ICD-10-CM | POA: Diagnosis not present

## 2022-10-16 DIAGNOSIS — H10029 Other mucopurulent conjunctivitis, unspecified eye: Secondary | ICD-10-CM | POA: Diagnosis not present

## 2022-11-01 DIAGNOSIS — H5789 Other specified disorders of eye and adnexa: Secondary | ICD-10-CM | POA: Diagnosis not present

## 2022-11-01 DIAGNOSIS — H579 Unspecified disorder of eye and adnexa: Secondary | ICD-10-CM | POA: Diagnosis not present

## 2022-11-01 DIAGNOSIS — H04203 Unspecified epiphora, bilateral lacrimal glands: Secondary | ICD-10-CM | POA: Diagnosis not present

## 2022-11-01 DIAGNOSIS — R030 Elevated blood-pressure reading, without diagnosis of hypertension: Secondary | ICD-10-CM | POA: Diagnosis not present

## 2022-12-05 DIAGNOSIS — J029 Acute pharyngitis, unspecified: Secondary | ICD-10-CM | POA: Diagnosis not present

## 2022-12-12 DIAGNOSIS — H1031 Unspecified acute conjunctivitis, right eye: Secondary | ICD-10-CM | POA: Diagnosis not present

## 2023-01-28 DIAGNOSIS — M170 Bilateral primary osteoarthritis of knee: Secondary | ICD-10-CM | POA: Diagnosis not present

## 2023-01-28 DIAGNOSIS — R7301 Impaired fasting glucose: Secondary | ICD-10-CM | POA: Diagnosis not present

## 2023-02-11 DIAGNOSIS — E119 Type 2 diabetes mellitus without complications: Secondary | ICD-10-CM | POA: Diagnosis not present

## 2023-02-11 DIAGNOSIS — H2513 Age-related nuclear cataract, bilateral: Secondary | ICD-10-CM | POA: Diagnosis not present

## 2023-05-10 DIAGNOSIS — J329 Chronic sinusitis, unspecified: Secondary | ICD-10-CM | POA: Diagnosis not present

## 2023-05-10 DIAGNOSIS — J40 Bronchitis, not specified as acute or chronic: Secondary | ICD-10-CM | POA: Diagnosis not present

## 2023-05-10 DIAGNOSIS — R0989 Other specified symptoms and signs involving the circulatory and respiratory systems: Secondary | ICD-10-CM | POA: Diagnosis not present

## 2023-05-10 DIAGNOSIS — R030 Elevated blood-pressure reading, without diagnosis of hypertension: Secondary | ICD-10-CM | POA: Diagnosis not present

## 2023-05-10 DIAGNOSIS — R059 Cough, unspecified: Secondary | ICD-10-CM | POA: Diagnosis not present

## 2023-05-29 DIAGNOSIS — H04411 Chronic dacryocystitis of right lacrimal passage: Secondary | ICD-10-CM | POA: Diagnosis not present

## 2023-07-01 DIAGNOSIS — R109 Unspecified abdominal pain: Secondary | ICD-10-CM | POA: Diagnosis not present

## 2023-07-01 DIAGNOSIS — N390 Urinary tract infection, site not specified: Secondary | ICD-10-CM | POA: Diagnosis not present

## 2023-07-01 DIAGNOSIS — Z79899 Other long term (current) drug therapy: Secondary | ICD-10-CM | POA: Diagnosis not present

## 2023-07-01 DIAGNOSIS — R410 Disorientation, unspecified: Secondary | ICD-10-CM | POA: Diagnosis not present

## 2023-07-01 DIAGNOSIS — R300 Dysuria: Secondary | ICD-10-CM | POA: Diagnosis not present

## 2023-07-15 DIAGNOSIS — G4700 Insomnia, unspecified: Secondary | ICD-10-CM | POA: Diagnosis not present

## 2023-07-15 DIAGNOSIS — N3090 Cystitis, unspecified without hematuria: Secondary | ICD-10-CM | POA: Diagnosis not present

## 2023-07-15 DIAGNOSIS — R030 Elevated blood-pressure reading, without diagnosis of hypertension: Secondary | ICD-10-CM | POA: Diagnosis not present

## 2023-09-19 ENCOUNTER — Ambulatory Visit (INDEPENDENT_AMBULATORY_CARE_PROVIDER_SITE_OTHER)
Admission: EM | Admit: 2023-09-19 | Discharge: 2023-09-19 | Disposition: A | Discharge status: Home / Self Care | Source: Home / Self Care

## 2023-09-19 ENCOUNTER — Other Ambulatory Visit (INDEPENDENT_AMBULATORY_CARE_PROVIDER_SITE_OTHER)
Admission: EM | Admit: 2023-09-19 | Discharge: 2023-09-23 | Disposition: A | Discharge status: Home / Self Care | Source: Home / Self Care | Admitting: Psychiatry

## 2023-09-19 ENCOUNTER — Encounter (HOSPITAL_COMMUNITY): Payer: Self-pay | Admitting: Emergency Medicine

## 2023-09-19 ENCOUNTER — Other Ambulatory Visit: Payer: Self-pay

## 2023-09-19 ENCOUNTER — Emergency Department (HOSPITAL_COMMUNITY)
Admission: EM | Admit: 2023-09-19 | Discharge: 2023-09-19 | Disposition: A | Discharge status: Other Acute Inpatient Hospital | Attending: Emergency Medicine | Admitting: Emergency Medicine

## 2023-09-19 DIAGNOSIS — E876 Hypokalemia: Secondary | ICD-10-CM | POA: Insufficient documentation

## 2023-09-19 DIAGNOSIS — F411 Generalized anxiety disorder: Secondary | ICD-10-CM | POA: Insufficient documentation

## 2023-09-19 DIAGNOSIS — R45851 Suicidal ideations: Secondary | ICD-10-CM | POA: Insufficient documentation

## 2023-09-19 DIAGNOSIS — F339 Major depressive disorder, recurrent, unspecified: Secondary | ICD-10-CM | POA: Insufficient documentation

## 2023-09-19 DIAGNOSIS — Z7982 Long term (current) use of aspirin: Secondary | ICD-10-CM | POA: Diagnosis not present

## 2023-09-19 DIAGNOSIS — F329 Major depressive disorder, single episode, unspecified: Secondary | ICD-10-CM | POA: Insufficient documentation

## 2023-09-19 DIAGNOSIS — F419 Anxiety disorder, unspecified: Secondary | ICD-10-CM | POA: Insufficient documentation

## 2023-09-19 DIAGNOSIS — F418 Other specified anxiety disorders: Secondary | ICD-10-CM | POA: Diagnosis present

## 2023-09-19 DIAGNOSIS — R4589 Other symptoms and signs involving emotional state: Secondary | ICD-10-CM | POA: Insufficient documentation

## 2023-09-19 DIAGNOSIS — F332 Major depressive disorder, recurrent severe without psychotic features: Secondary | ICD-10-CM | POA: Insufficient documentation

## 2023-09-19 DIAGNOSIS — Z79899 Other long term (current) drug therapy: Secondary | ICD-10-CM | POA: Insufficient documentation

## 2023-09-19 DIAGNOSIS — F600 Paranoid personality disorder: Secondary | ICD-10-CM | POA: Diagnosis not present

## 2023-09-19 DIAGNOSIS — R9431 Abnormal electrocardiogram [ECG] [EKG]: Secondary | ICD-10-CM | POA: Diagnosis not present

## 2023-09-19 DIAGNOSIS — F32A Depression, unspecified: Secondary | ICD-10-CM

## 2023-09-19 LAB — COMPREHENSIVE METABOLIC PANEL
ALT: 15 U/L (ref 0–44)
AST: 18 U/L (ref 15–41)
Albumin: 3.7 g/dL (ref 3.5–5.0)
Alkaline Phosphatase: 62 U/L (ref 38–126)
Anion gap: 11 (ref 5–15)
BUN: 11 mg/dL (ref 8–23)
CO2: 24 mmol/L (ref 22–32)
Calcium: 9.6 mg/dL (ref 8.9–10.3)
Chloride: 107 mmol/L (ref 98–111)
Creatinine, Ser: 0.86 mg/dL (ref 0.44–1.00)
GFR, Estimated: >60 mL/min (ref >60)
Glucose, Bld: 81 mg/dL (ref 70–99)
Potassium: 3.3 mmol/L — ABNORMAL LOW (ref 3.5–5.1)
Sodium: 142 mmol/L (ref 135–145)
Total Bilirubin: 0.5 mg/dL (ref 0.0–1.2)
Total Protein: 6.8 g/dL (ref 6.5–8.1)

## 2023-09-19 LAB — URINALYSIS, ROUTINE W REFLEX MICROSCOPIC
Bilirubin Urine: NEGATIVE
Glucose, UA: NEGATIVE mg/dL
Hgb urine dipstick: NEGATIVE
Ketones, ur: NEGATIVE mg/dL
Nitrite: NEGATIVE
Protein, ur: NEGATIVE mg/dL
Specific Gravity, Urine: 1.016 (ref 1.005–1.030)
pH: 6 (ref 5.0–8.0)

## 2023-09-19 LAB — CBC WITH DIFFERENTIAL/PLATELET
Abs Immature Granulocytes: 0.01 10*3/uL (ref 0.00–0.07)
Basophils Absolute: 0 10*3/uL (ref 0.0–0.1)
Basophils Relative: 0 %
Eosinophils Absolute: 0 10*3/uL (ref 0.0–0.5)
Eosinophils Relative: 0 %
HCT: 40.7 % (ref 36.0–46.0)
Hemoglobin: 13.4 g/dL (ref 12.0–15.0)
Immature Granulocytes: 0 %
Lymphocytes Relative: 25 %
Lymphs Abs: 1.3 10*3/uL (ref 0.7–4.0)
MCH: 30.7 pg (ref 26.0–34.0)
MCHC: 32.9 g/dL (ref 30.0–36.0)
MCV: 93.1 fL (ref 80.0–100.0)
Monocytes Absolute: 0.4 10*3/uL (ref 0.1–1.0)
Monocytes Relative: 7 %
Neutro Abs: 3.7 10*3/uL (ref 1.7–7.7)
Neutrophils Relative %: 68 %
Platelets: 238 10*3/uL (ref 150–400)
RBC: 4.37 MIL/uL (ref 3.87–5.11)
RDW: 13 % (ref 11.5–15.5)
WBC: 5.4 10*3/uL (ref 4.0–10.5)
nRBC: 0 % (ref 0.0–0.2)

## 2023-09-19 LAB — RAPID URINE DRUG SCREEN, HOSP PERFORMED
Amphetamines: NOT DETECTED
Barbiturates: NOT DETECTED
Benzodiazepines: NOT DETECTED
Cocaine: NOT DETECTED
Opiates: NOT DETECTED
Tetrahydrocannabinol: NOT DETECTED

## 2023-09-19 LAB — ETHANOL: Alcohol, Ethyl (B): <10 mg/dL (ref <10)

## 2023-09-19 MED ORDER — BUSPIRONE HCL 10 MG PO TABS: 20.0000 mg | ORAL_TABLET | Freq: Every morning | ORAL | Status: DC

## 2023-09-19 MED ORDER — MAGNESIUM HYDROXIDE 400 MG/5ML PO SUSP: 30.0000 mL | Freq: Every day | ORAL | Status: DC | PRN

## 2023-09-19 MED ORDER — ALUM & MAG HYDROXIDE-SIMETH 200-200-20 MG/5ML PO SUSP: 30.0000 mL | ORAL | Status: DC | PRN

## 2023-09-19 MED ORDER — TRAZODONE HCL 100 MG PO TABS: 100.0000 mg | ORAL_TABLET | Freq: Every day | ORAL | Status: DC

## 2023-09-19 MED ORDER — ADULT MULTIVITAMIN W/MINERALS CH: 1.0000 | ORAL_TABLET | Freq: Every day | ORAL | Status: DC

## 2023-09-19 MED ORDER — ACETAMINOPHEN 325 MG PO TABS: 650.0000 mg | ORAL_TABLET | Freq: Four times a day (QID) | ORAL | Status: DC | PRN

## 2023-09-19 MED ORDER — BUSPIRONE HCL 10 MG PO TABS: 20.0000 mg | ORAL_TABLET | ORAL | Status: DC

## 2023-09-19 MED ORDER — TOPIRAMATE 25 MG PO TABS
50.0000 mg | ORAL_TABLET | Freq: Two times a day (BID) | ORAL | Status: DC
  Administered 2023-09-19: 50 mg via ORAL
  Filled 2023-09-19: qty 2

## 2023-09-19 MED ORDER — BUSPIRONE HCL 10 MG PO TABS
10.0000 mg | ORAL_TABLET | ORAL | Status: DC
Start: 2023-09-19 — End: 2023-09-19

## 2023-09-19 MED ORDER — BUSPIRONE HCL 10 MG PO TABS: 10.0000 mg | ORAL_TABLET | Freq: Every evening | ORAL | Status: DC

## 2023-09-19 MED ORDER — ADULT MULTIVITAMIN W/MINERALS CH
1.0000 | ORAL_TABLET | Freq: Every day | ORAL | Status: DC
  Administered 2023-09-20 – 2023-09-23 (×4): 1 via ORAL
  Filled 2023-09-19 (×4): qty 1

## 2023-09-19 MED ORDER — POTASSIUM CHLORIDE CRYS ER 20 MEQ PO TBCR
40.0000 meq | EXTENDED_RELEASE_TABLET | Freq: Once | ORAL | Status: AC
  Administered 2023-09-19: 40 meq via ORAL
  Filled 2023-09-19: qty 2

## 2023-09-19 MED ORDER — BUSPIRONE HCL 5 MG PO TABS
20.0000 mg | ORAL_TABLET | Freq: Every day | ORAL | Status: DC
  Administered 2023-09-20 – 2023-09-23 (×4): 20 mg via ORAL
  Filled 2023-09-19 (×4): qty 4

## 2023-09-19 MED ORDER — OLANZAPINE 5 MG PO TBDP
5.0000 mg | ORAL_TABLET | Freq: Three times a day (TID) | ORAL | Status: DC | PRN
Start: 2023-09-19 — End: 2023-09-23

## 2023-09-19 MED ORDER — TRAZODONE HCL 100 MG PO TABS
100.0000 mg | ORAL_TABLET | Freq: Every day | ORAL | Status: DC
  Administered 2023-09-19 – 2023-09-22 (×4): 100 mg via ORAL
  Filled 2023-09-19 (×3): qty 1

## 2023-09-19 MED ORDER — OLANZAPINE 5 MG PO TBDP: 5.0000 mg | ORAL_TABLET | Freq: Three times a day (TID) | ORAL | Status: DC | PRN

## 2023-09-19 MED ORDER — OLANZAPINE 10 MG IM SOLR: 5.0000 mg | Freq: Three times a day (TID) | INTRAMUSCULAR | Status: DC | PRN

## 2023-09-19 MED ORDER — BUSPIRONE HCL 5 MG PO TABS
10.0000 mg | ORAL_TABLET | Freq: Every day | ORAL | Status: DC
  Administered 2023-09-20 – 2023-09-22 (×3): 10 mg via ORAL
  Filled 2023-09-19 (×3): qty 2

## 2023-09-19 MED ORDER — BUSPIRONE HCL 10 MG PO TABS
10.0000 mg | ORAL_TABLET | Freq: Every evening | ORAL | Status: DC
  Administered 2023-09-19: 10 mg via ORAL
  Filled 2023-09-19: qty 1

## 2023-09-19 MED ORDER — TRAZODONE HCL 100 MG PO TABS
100.0000 mg | ORAL_TABLET | Freq: Every day | ORAL | Status: DC
  Filled 2023-09-19: qty 1

## 2023-09-19 NOTE — ED Notes (Addendum)
 Pt dressed out in purple scrubs. Pt family bedside until patient has been medically cleared. Pt family has taken patient jewelry. Pt clothes are in the possession of pt family at this time.

## 2023-09-19 NOTE — ED Notes (Signed)
 Per Renne Crigler, PA-C and Charge RN Kerby Moors pt does not need a sitter at this time.

## 2023-09-19 NOTE — ED Triage Notes (Signed)
 Pt reports hx of anxiety and depression that has gotten worse since December. Seen at [facility] yesterday and was told to come to ED for evaluation and possible placement. Denies any SI/HI. On Buspar for anxiety, compliant with medications. Cooperative in triage with no signs of agitation. Family states she reached out to psychiatrist prior to coming and was unsuccessful in getting a response.

## 2023-09-19 NOTE — ED Notes (Signed)
 Patient trans in from [facility] to [facility] with worsening depression and seeking medication. On arrival, pt A/Ox4  and MAE. Patient tolerated the admission process well. Skin assessment done and WNL. Denies SI/HI/AVH. Offered something for patient to eat or snack on but declined it. Oriented to the unit and unit rules.Accompanied patient to her room and made her comfortable in bed. Will monitor for safety.

## 2023-09-19 NOTE — ED Notes (Signed)
 Patient's family took all of patient's belongings home including purse

## 2023-09-19 NOTE — ED Notes (Signed)
 This RN attempted to contact pt daughter listed in contacts to notify of departure. NO answer, no voicemail left.

## 2023-09-19 NOTE — ED Provider Notes (Signed)
 Facility Based Crisis Admission H&P  Date: 09/20/23 Patient Name: Melissa Landry MRN: [id number] Chief Complaint: increase anxiety and depression   Diagnoses:  Final diagnoses:  Recurrent major depressive disorder, remission status unspecified (HCC)  GAD (generalized anxiety disorder)  Difficulty coping    HPI: Melissa Landry,  68 y/o female with a history of major depressive disorder, anxiety, suicidal ideation, presented to [facility] as a [facility],.  Patient was seen by psych today and recommended for inpatient.  According to the patient she had called the crisis center yesterday because she was having increased anxiety and depression this started back in December 2024.  According to the patient she has been over thinking, being paranoid, and increased depression.  Patient stated that she currently sees a psychiatrist in [facility] however he do not want to increase her medications.  Per the patient she does have an appointment with him on 11/04/2023.  Patient also reports that she was hospitalized around 15 years ago at [facility] for suicidal thoughts.  Patient currently lives with her daughter and her daughter's grandson.  Patient is retired.  Face-to-face evaluation of patient, patient is alert and oriented x 4, speech is clear, maintaining eye contact.  Patient does appear to be a little bit anxious and depressed however she is very cooperative and answered questions appropriately.  Patient denies current SI, HI, AVH.  Denies current paranoia.  But stating that she was having paranoia yesterday.  Patient denies alcohol use denies illicit drug use denies smoking.  Patient denies access to guns denies wanting to hurt herself or others.  At this present moment patient does not seem to be influenced by internal stimuli.  PHQ9 completed pt scored a 19 Recommend FBC  PHQ 2-9:  Flowsheet Row ED from 09/19/2023 in [facility]  Thoughts that you would  be better off dead, or of hurting yourself in some way More than half the days  PHQ-9 Total Score 19       Flowsheet Row ED from 09/19/2023 in [facility] Most recent reading at 09/19/2023 10:56 PM ED from 09/19/2023 in [facility] Emergency Department at [facility] Most recent reading at 09/19/2023 10:46 AM  C-SSRS RISK CATEGORY No Risk No Risk         Total Time spent with patient: 20 minutes  Musculoskeletal  Strength & Muscle Tone: within normal limits Gait & Station: normal Patient leans: N/A  Psychiatric Specialty Exam  Presentation General Appearance:  Casual  Eye Contact: Good  Speech: Clear and Coherent  Speech Volume: Normal  Handedness: Right   Mood and Affect  Mood: Anxious; Depressed  Affect: Congruent   Thought Process  Thought Processes: Coherent  Descriptions of Associations:Intact  Orientation:Full (Time, Place and Person)  Thought Content:Logical    Hallucinations:Hallucinations: None  Ideas of Reference:None  Suicidal Thoughts:Suicidal Thoughts: No  Homicidal Thoughts:Homicidal Thoughts: No   Sensorium  Memory: Immediate Good  Judgment: Fair  Insight: Fair   Art therapist  Concentration: Fair  Attention Span: Good  Recall: Good  Fund of Knowledge: Good  Language: Good   Psychomotor Activity  Psychomotor Activity: Psychomotor Activity: Normal   Assets  Assets: Desire for Improvement; Social Support   Sleep  Sleep: Sleep: Fair Number of Hours of Sleep: 6   Nutritional Assessment (For OBS and FBC admissions only) Has the patient had a weight loss or gain of 10 pounds or more in the last 3 months?: No Has  the patient had a decrease in food intake/or appetite?: No Does the patient have dental problems?: No Does the patient have eating habits or behaviors that may be indicators of an eating disorder including binging or inducing vomiting?: No Has the  patient recently lost weight without trying?: 0 Has the patient been eating poorly because of a decreased appetite?: 0 Malnutrition Screening Tool Score: 0    Physical Exam HENT:     Head: Normocephalic.     Nose: Nose normal.  Eyes:     Pupils: Pupils are equal, round, and reactive to light.  Cardiovascular:     Rate and Rhythm: Normal rate.  Pulmonary:     Effort: Pulmonary effort is normal.  Musculoskeletal:        General: Normal range of motion.     Cervical back: Normal range of motion.  Skin:    General: Skin is warm.  Neurological:     General: No focal deficit present.     Mental Status: She is alert.  Psychiatric:        Mood and Affect: Mood normal.        Behavior: Behavior normal.        Thought Content: Thought content normal.        Judgment: Judgment normal.    Review of Systems  Constitutional: Negative.   HENT: Negative.    Eyes: Negative.   Respiratory: Negative.    Cardiovascular: Negative.   Gastrointestinal: Negative.   Genitourinary: Negative.   Musculoskeletal: Negative.   Skin: Negative.   Neurological: Negative.   Psychiatric/Behavioral:  Positive for depression. The patient is nervous/anxious.     Blood pressure 120/76, pulse 66, temperature 97.7 F (36.5 C), temperature source Oral, resp. rate 16, SpO2 100%. There is no height or weight on file to calculate BMI.  Past Psychiatric History: MDD, GAD, SI   Is the patient at risk to self? No  Has the patient been a risk to self in the past 6 months? No .    Has the patient been a risk to self within the distant past? Yes   Is the patient a risk to others? No   Has the patient been a risk to others in the past 6 months? No   Has the patient been a risk to others within the distant past? No   Past Medical History: see chart  Family History: unknown  Social History: denies  Last Labs:  Admission on 09/19/2023, Discharged on 09/19/2023  Component Date Value Ref Range Status   Sodium  09/19/2023 142  135 - 145 mmol/L Final   Potassium 09/19/2023 3.3 (L)  3.5 - 5.1 mmol/L Final   Chloride 09/19/2023 107  98 - 111 mmol/L Final   CO2 09/19/2023 24  22 - 32 mmol/L Final   Glucose, Bld 09/19/2023 81  70 - 99 mg/dL Final   Glucose reference range applies only to samples taken after fasting for at least 8 hours.   BUN 09/19/2023 11  8 - 23 mg/dL Final   Creatinine, Ser 09/19/2023 0.86  0.44 - 1.00 mg/dL Final   Calcium 40/98/1191 9.6  8.9 - 10.3 mg/dL Final   Total Protein 47/82/9562 6.8  6.5 - 8.1 g/dL Final   Albumin 13/02/6577 3.7  3.5 - 5.0 g/dL Final   AST 46/96/2952 18  15 - 41 U/L Final   ALT 09/19/2023 15  0 - 44 U/L Final   Alkaline Phosphatase 09/19/2023 62  38 - 126 U/L Final  Total Bilirubin 09/19/2023 0.5  0.0 - 1.2 mg/dL Final   GFR, Estimated 09/19/2023 >60  >60 mL/min Final   Comment: (NOTE) Calculated using the CKD-EPI Creatinine Equation (2021)    Anion gap 09/19/2023 11  5 - 15 Final   Performed at [facility], [address], [place], [place] [address]   Alcohol, Ethyl (B) 09/19/2023 <10  <10 mg/dL Final   Comment: (NOTE) Lowest detectable limit for serum alcohol is 10 mg/dL.  For medical purposes only. Performed at [facility], [address] [place], [place] [address]    Opiates 09/19/2023 NONE DETECTED  NONE DETECTED Final   Cocaine 09/19/2023 NONE DETECTED  NONE DETECTED Final   Benzodiazepines 09/19/2023 NONE DETECTED  NONE DETECTED Final   Amphetamines 09/19/2023 NONE DETECTED  NONE DETECTED Final   Tetrahydrocannabinol 09/19/2023 NONE DETECTED  NONE DETECTED Final   Barbiturates 09/19/2023 NONE DETECTED  NONE DETECTED Final   Comment: (NOTE) DRUG SCREEN FOR MEDICAL PURPOSES ONLY.  IF CONFIRMATION IS NEEDED FOR ANY PURPOSE, NOTIFY LAB WITHIN 5 DAYS.  LOWEST DETECTABLE LIMITS FOR URINE DRUG SCREEN Drug Class                     Cutoff (ng/mL) Amphetamine and metabolites    1000 Barbiturate and metabolites     200 Benzodiazepine                 200 Opiates and metabolites        300 Cocaine and metabolites        300 THC                            50 Performed at [facility], [address] [place], [place] [address]    WBC 09/19/2023 5.4  4.0 - 10.5 K/uL Final   RBC 09/19/2023 4.37  3.87 - 5.11 MIL/uL Final   Hemoglobin 09/19/2023 13.4  12.0 - 15.0 g/dL Final   HCT 91/47/8295 40.7  36.0 - 46.0 % Final   MCV 09/19/2023 93.1  80.0 - 100.0 fL Final   MCH 09/19/2023 30.7  26.0 - 34.0 pg Final   MCHC 09/19/2023 32.9  30.0 - 36.0 g/dL Final   RDW 62/13/0865 13.0  11.5 - 15.5 % Final   Platelets 09/19/2023 238  150 - 400 K/uL Final   nRBC 09/19/2023 0.0  0.0 - 0.2 % Final   Neutrophils Relative % 09/19/2023 68  % Final   Neutro Abs 09/19/2023 3.7  1.7 - 7.7 K/uL Final   Lymphocytes Relative 09/19/2023 25  % Final   Lymphs Abs 09/19/2023 1.3  0.7 - 4.0 K/uL Final   Monocytes Relative 09/19/2023 7  % Final   Monocytes Absolute 09/19/2023 0.4  0.1 - 1.0 K/uL Final   Eosinophils Relative 09/19/2023 0  % Final   Eosinophils Absolute 09/19/2023 0.0  0.0 - 0.5 K/uL Final   Basophils Relative 09/19/2023 0  % Final   Basophils Absolute 09/19/2023 0.0  0.0 - 0.1 K/uL Final   Immature Granulocytes 09/19/2023 0  % Final   Abs Immature Granulocytes 09/19/2023 0.01  0.00 - 0.07 K/uL Final   Performed at [facility], [address] [place], [place] [address]   Color, Urine 09/19/2023 YELLOW  YELLOW Final   APPearance 09/19/2023 HAZY (A)  CLEAR Final   Specific Gravity, Urine 09/19/2023 1.016  1.005 - 1.030 Final   pH  09/19/2023 6.0  5.0 - 8.0 Final   Glucose, UA 09/19/2023 NEGATIVE  NEGATIVE mg/dL Final   Hgb urine dipstick 09/19/2023 NEGATIVE  NEGATIVE Final   Bilirubin Urine 09/19/2023 NEGATIVE  NEGATIVE Final   Ketones, ur 09/19/2023 NEGATIVE  NEGATIVE mg/dL Final   Protein, ur 62/13/0865 NEGATIVE  NEGATIVE mg/dL Final   Nitrite 78/46/9629 NEGATIVE  NEGATIVE Final   Leukocytes,Ua  09/19/2023 MODERATE (A)  NEGATIVE Final   RBC / HPF 09/19/2023 6-10  0 - 5 RBC/hpf Final   WBC, UA 09/19/2023 6-10  0 - 5 WBC/hpf Final   Bacteria, UA 09/19/2023 RARE (A)  NONE SEEN Final   Squamous Epithelial / HPF 09/19/2023 6-10  0 - 5 /HPF Final   Mucus 09/19/2023 PRESENT   Final   Amorphous Crystal 09/19/2023 PRESENT   Final   Performed at [facility], [address], [place], [place] [address]    Allergies: Patient has no known allergies.  Medications:  Facility Ordered Medications  Medication   [COMPLETED] potassium chloride SA (KLOR-CON M) CR tablet 40 mEq   acetaminophen (TYLENOL) tablet 650 mg   alum & mag hydroxide-simeth (MAALOX/MYLANTA) 200-200-20 MG/5ML suspension 30 mL   magnesium hydroxide (MILK OF MAGNESIA) suspension 30 mL   OLANZapine zydis (ZYPREXA) disintegrating tablet 5 mg   OLANZapine (ZYPREXA) injection 5 mg   busPIRone (BUSPAR) tablet 20 mg   multivitamin with minerals tablet 1 tablet   busPIRone (BUSPAR) tablet 10 mg   traZODone (DESYREL) tablet 100 mg   PTA Medications  Medication Sig   topiramate (TOPAMAX) 50 MG tablet Take 50 mg by mouth 2 (two) times daily.    Long Term Goals: Improvement in symptoms so as ready for discharge  Short Term Goals: Patient will verbalize feelings in meetings with treatment team members., Patient will attend at least of 50% of the groups daily., Pt will complete the PHQ9 on admission, day 3 and discharge., Patient will participate in completing the [place] Suicide Severity Rating Scale, Patient will score a low risk of violence for 24 hours prior to discharge, and Patient will take medications as prescribed daily.  Medical Decision Making  Inpatient FBC   Lab Orders  No laboratory test(s) ordered today    Meds ordered this encounter  Medications   DISCONTD: acetaminophen (TYLENOL) tablet 650 mg   DISCONTD: alum & mag hydroxide-simeth (MAALOX/MYLANTA) 200-200-20 MG/5ML suspension 30 mL   DISCONTD:  magnesium hydroxide (MILK OF MAGNESIA) suspension 30 mL   DISCONTD: OLANZapine zydis (ZYPREXA) disintegrating tablet 5 mg   DISCONTD: OLANZapine (ZYPREXA) injection 5 mg   DISCONTD: busPIRone (BUSPAR) tablet 10-20 mg   DISCONTD: multivitamin with minerals tablet 1 tablet   DISCONTD: traZODone (DESYREL) tablet 100 mg   DISCONTD: busPIRone (BUSPAR) tablet 10 mg   DISCONTD: busPIRone (BUSPAR) tablet 20 mg     Recommendations  Based on my evaluation the patient does not appear to have an emergency medical condition.  Sindy Guadeloupe, NP 09/20/23  4:47 AM

## 2023-09-19 NOTE — Consult Note (Signed)
 [facility] Psychiatric Consult Initial  Patient Name: .Melissa Landry  MRN: [id number]  DOB: 09-12-1955  Consult Order details:  Orders (From admission, onward)     Start     Ordered   09/19/23 1127  CONSULT TO CALL ACT TEAM       Ordering Provider: Renne Crigler, PA-C  Provider:  (Not yet assigned)  Question:  Reason for Consult?  Answer:  Psych consult   09/19/23 1126             Mode of Visit: Tele-visit Virtual Statement:TELE PSYCHIATRY ATTESTATION & CONSENT As the provider for this telehealth consult, I attest that I verified the patient's identity using two separate identifiers, introduced myself to the patient, provided my credentials, disclosed my location, and performed this encounter via a HIPAA-compliant, real-time, face-to-face, two-way, interactive audio and video platform and with the full consent and agreement of the patient (or guardian as applicable.) Patient physical location: [facility]. Telehealth provider physical location: home office in state of [place].   Video start time: 1400 Video end time: 1436    Psychiatry Consult Evaluation  Service Date: September 19, 2023 LOS:  LOS: 0 days  Chief Complaint "I'm just so anxious and I don't know how to calm down."  Primary Psychiatric Diagnoses  Anxiety State  Assessment  Halei C Helf is a 68 y.o. female admitted: Presented to the EDfor 09/19/2023 10:38 AM for evaluation of severe anxiety, worsening over the past 3-4 months,triggered by psychosocial stressors. She carries the psychiatric diagnoses of anxiety, bipolar depression and has a past medical history of  pneumonia.   Her current presentation of anxious and depressed mood, rocking back and forth, constantly rubbing her hands is most consistent with anxiety diagnosis. She meets criteria for inpatient admission based on above and decreased appetite and functional limitations related to anxiety and collateral provided by her daughter.  Current outpatient  psychotropic medications include buspar,  and trazodone and historically she has had a good response to these medications. She was compliant with medications prior to admission as evidenced by patient report. Above was discussed with patient who voluntarily accepts admission.   On initial examination, patient is appropriately groomed, dressed in burgundy hospital scrubs. She is alert and oriented x4; her speech is slow but coherent.  She endorses passive SI but denies plan or intent for self harm.   Please see plan below for detailed recommendations.   Diagnoses:  Active Hospital problems: Principal Problem:   Anxiety state    Plan   ## Psychiatric Medication Recommendations:  Recommend restarting home medications Buspar 20mg  po qam Buspar 10mg  po at bedtime Trazodone 100mg  po at bedtime Consider hydroxyzine 25mg  po TID prn anxiety  ## Medical Decision Making Capacity: Not specifically addressed in this encounter  ## Further Work-up:  -- Recommend covid, flu screening as this is required prior to transfer to inpatient unit. -- most recent EKG on 09/19/2023 had QtC of 404 -- Pertinent labwork reviewed earlier this admission includes:  CMP- low K 3.3 which was treated with K supplementation by ED provider; Otherwise, CMP, CBC are within normal limits  UDS- negative.   ## Disposition:-- We recommend inpatient psychiatric hospitalization when medically cleared. Patient is under voluntary admission status at this time; please IVC if attempts to leave hospital.  ## Behavioral / Environmental: -Utilize compassion and acknowledge the patient's experiences while setting clear and realistic expectations for care.    ## Safety and Observation Level:  - Based on my clinical  evaluation, I estimate the patient to be at low risk of self harm in the current setting. - At this time, we recommend  routine. This decision is based on my review of the chart including patient's history and current  presentation, interview of the patient, mental status examination, and consideration of suicide risk including evaluating suicidal ideation, plan, intent, suicidal or self-harm behaviors, risk factors, and protective factors. This judgment is based on our ability to directly address suicide risk, implement suicide prevention strategies, and develop a safety plan while the patient is in the clinical setting. Please contact our team if there is a concern that risk level has changed.  CSSR Risk Category:C-SSRS RISK CATEGORY: No Risk  Suicide Risk Assessment: Patient has following modifiable risk factors for suicide: under treated depression , which we are addressing by referral for psychiatric admission. Patient has following non-modifiable or demographic risk factors for suicide: early widowhood and psychiatric hospitalization Patient has the following protective factors against suicide: Supportive family, Minor children in the home, no history of suicide attempts, and no history of NSSIB  Thank you for this consult request. Recommendations have been communicated to the primary team.  We will sign off at this time.   Chales Abrahams, NP       History of Present Illness  Relevant Aspects of Hospital ED Course:  Admitted on 09/19/2023 for worsening anxiety.   Patient Report:  Patient greeted by this writer and given anticipatory guidance.  Patient reports she's been anxious since December following a dx of urinary tract infection.  She reports the infection was treated but her anxiety has not improved. She reports most days she is anxious, and always waiting for the bottom to fall out; low motivation, isolating behaviors and passive suicidal thoughts.  She states she's been too anxious to eat but does snack during the day; she states she's able to sleep at night but this is because she take trazodone.  She reports being followed by Dr. Geanie Cooley at [facility] for psychiatry.  She reports she's prescribed buspar  20mg  po qam and 10mg  po qpm; am dose was increased in December but she has not noticed improvement in anxiety; she also takes trazodone 100mg  po at bedtime which helps her sleep. She states she was hoping to get medications to help her anxiety as she feels she can no longer manager her symptoms at home.   Psych ROS:  Depression: yes Anxiety:  yes Mania (lifetime and current): denies Psychosis: (lifetime and current): denies  Collateral information: Pt gave permission to call her daughter for collateral. Contacted Henretta Quist, patient's daughter at [phone] on 09/19/2023@1748 . She reports her mother has hx of bipolar depression and severe anxiety.  She reports her mother came to the emergency department via referral for [facility] for severe anxiety yesterday.  She reports patient has not been eating well, is constantly on edge and constantly pulling her hair and rubbing her hands and fidgeting with her clothing.  She also reports her mother is depressed.  Historically, her symptoms exacerbated 4 months ago in December.  She is unsure of a specific trigger but states patient had unknown charges on her debit card; was able to resolve it but she was very upset d/t being on a fixed income; she also reports patient's mother passed away 3 years ago in Jun 24, 2024 and she reports the anniversary month as difficult for her.  She reports patient has been in contact with her psychiatrist, Dr. Geanie Cooley for the past 3 months  but states he did not want to make any medication adjustments.    She reports she previously took lamotrigne which was stopped one year ago; and previously took fluoxetine which was stopped over 2 years ago. She is unsure why meds were stopped.    She reports her mother has a hx for depression but has not endorsed suicidal thought recently.  She reports remote hx of severe suicidal ideation, 16 years ago when patient had plans to jump off a bridge.  She was hospitalized at [facility], has not had  recurrence of severe symptoms.    Review of Systems  Constitutional: Negative.   HENT: Negative.    Eyes: Negative.   Respiratory: Negative.    Cardiovascular: Negative.   Gastrointestinal: Negative.   Genitourinary: Negative.   Musculoskeletal: Negative.   Skin: Negative.   Neurological: Negative.   Endo/Heme/Allergies: Negative.   Psychiatric/Behavioral:  Positive for depression and suicidal ideas (passive but she denies plan or intent for self harm). Negative for hallucinations and substance abuse. The patient is nervous/anxious.      Psychiatric and Social History  Psychiatric History:  Information collected from patient and her daughter; chart review  Prev Dx/Sx: depression, anxiety Current Psych Provider: Dr. Delice Bison at [facility] Meds (current): as outlined above Previous Med Trials: lamotrigine-unsure why it was stopped; prozac- was stopped because she was dropping things Therapy: deferred  Prior Psych Hospitalization: yes, 16 year ago at [facility] for severe SI  Prior Self Harm: denies Prior Violence: denies  Family Psych History: pt mother had bipolar with mania Family Hx suicide: denies  Social History:  Developmental Hx: pt denies concern; reports she met all developmental milestones Educational Hx: deferred Occupational Hx: currently unemployed, lives off social security disability for her mental health and "pneumonia" Legal Hx: denies Living Situation: lives at home, her daughter and grandson live with her Spiritual Hx: pt denies Access to weapons/lethal means: pt denies   Substance History Alcohol: she denies  Tobacco: pt denies Illicit drugs: pt denies Prescription drug abuse: pt denies Rehab hx: pt denies  Exam Findings  Physical Exam: as outlined below Vital Signs:  Temp:  [97.8 F (36.6 C)] 97.8 F (36.6 C) (03/13 1041) Pulse Rate:  [69] 69 (03/13 1041) Resp:  [16] 16 (03/13 1041) BP: (113)/(77) 113/77 (03/13 1041) SpO2:  [100 %]  100 % (03/13 1041) Blood pressure 113/77, pulse 69, temperature 97.8 F (36.6 C), resp. rate 16, SpO2 100%. There is no height or weight on file to calculate BMI.  Physical Exam Cardiovascular:     Rate and Rhythm: Normal rate.     Pulses: Normal pulses.  Pulmonary:     Effort: Pulmonary effort is normal.  Musculoskeletal:        General: Normal range of motion.     Cervical back: Normal range of motion.  Neurological:     Mental Status: She is alert and oriented to person, place, and time. Mental status is at baseline.  Psychiatric:        Attention and Perception: Attention and perception normal.        Mood and Affect: Mood is anxious and depressed. Affect is blunt.        Speech: Speech normal.        Behavior: Behavior is cooperative.        Thought Content: Thought content is not paranoid or delusional. Thought content includes suicidal ideation. Thought content does not include homicidal ideation. Thought content does not include  homicidal or suicidal plan.        Cognition and Memory: Cognition and memory normal.        Judgment: Judgment normal.     Mental Status Exam: General Appearance: Casual  Orientation:  Full (Time, Place, and Person)  Memory:  Immediate;   Good Recent;   Good Remote;   Good  Concentration:  Concentration: Fair and Attention Span: Fair  Recall:  Good  Attention  Fair  Eye Contact:  Good  Speech:  Slow  Language:  Good  Volume:  Normal  Mood: "I'm feeling really anxious"  Affect:  Appropriate and Congruent  Thought Process:  Goal Directed  Thought Content:  Illogical and Rumination  Suicidal Thoughts:  No  Homicidal Thoughts:  No  Judgement:  Fair  Insight:  Fair  Psychomotor Activity:  Normal  Akathisia:  No  Fund of Knowledge:  Good      Assets:  Communication Skills Desire for Improvement Financial Resources/Insurance Housing Social Support  Cognition:  WNL  ADL's:  Intact  AIMS (if indicated):        Other History    These have been pulled in through the EMR, reviewed, and updated if appropriate.  Family History:  The patient's family history is not on file.  Medical History: Past Medical History:  Diagnosis Date   Anxiety    Depression    Pneumonia     Surgical History: Past Surgical History:  Procedure Laterality Date   CHEST TUBE INSERTION     for PNA   chest tube removal     ORIF ANKLE FRACTURE Left 03/26/2017   Procedure: OPEN REDUCTION INTERNAL FIXATION (ORIF) ANKLE FRACTURE;  Surgeon: Cammy Copa, MD;  Location: [facility] OR;  Service: Orthopedics;  Laterality: Left;   TONSILLECTOMY     age 49     Medications:   Current Facility-Administered Medications:    [START ON 09/20/2023] busPIRone (BUSPAR) tablet 20 mg, 20 mg, Oral, [id number] **AND** busPIRone (BUSPAR) tablet 10 mg, 10 mg, Oral, QPM, Benjiman Core, MD, 10 mg at 09/19/23 1706   [START ON 09/20/2023] multivitamin with minerals tablet 1 tablet, 1 tablet, Oral, Daily, Ophelia Shoulder E, NP   topiramate (TOPAMAX) tablet 50 mg, 50 mg, Oral, BID, Ophelia Shoulder E, NP, 50 mg at 09/19/23 1441   traZODone (DESYREL) tablet 100 mg, 100 mg, Oral, QHS, Ophelia Shoulder E, NP  Current Outpatient Medications:    busPIRone (BUSPAR) 10 MG tablet, Take 10-20 mg by mouth See admin instructions. Take 20 mg by mouth in the morning and 10 mg by mouth in the evening., Disp: , Rfl:    Multiple Vitamins-Minerals (MULTIVITAMIN WITH MINERALS) tablet, Take 1 tablet by mouth daily., Disp: , Rfl:    topiramate (TOPAMAX) 50 MG tablet, Take 50 mg by mouth 2 (two) times daily., Disp: , Rfl: 0   traZODone (DESYREL) 100 MG tablet, Take 100 mg by mouth at bedtime., Disp: , Rfl:   Allergies: No Known Allergies  Chales Abrahams, NP

## 2023-09-19 NOTE — ED Provider Notes (Signed)
 Patient has been evaluated by mental health and they are excepting the patient to the behavioral health urgent care under Dr. Enedina Finner.   Terrilee Files, MD 09/19/23 [phone]

## 2023-09-19 NOTE — ED Provider Notes (Signed)
 [place] EMERGENCY DEPARTMENT AT [facility] Provider Note   CSN: [id number] Arrival date & time: 09/19/23  1015     History  Chief Complaint  Patient presents with   Psychiatric Evaluation    Melissa Landry is a 68 y.o. female.  Patient with history of anxiety and depression, paranoia --presents to the emergency department for psychiatric clearance.  Patient states that she spoke with the crisis team at [facility] yesterday.  They recommended that she come to the emergency department for evaluation and possible inpatient placement.  Patient reports increased depression symptoms.  No overt suicidal ideation.  She is on fluoxetine, lamotrigine and topiramate.  She states that these have not been helping her.  Patient reports increased paranoia as well.  Family at bedside and supportive.  Patient denies any recent medical illnesses.  She did have a UTI few months ago.  No current symptoms.       Home Medications Prior to Admission medications   Medication Sig Start Date End Date Taking? Authorizing Provider  ALPRAZolam Prudy Feeler) 0.5 MG tablet Take 0.5 mg by mouth daily as needed for anxiety. 02/25/17   [provider]  aspirin EC 325 MG tablet Take 1 tablet (325 mg total) by mouth daily. 03/26/17   Cammy Copa, MD  baclofen (LIORESAL) 10 MG tablet Take 1 tablet (10 mg total) by mouth 3 (three) times daily. 03/29/17   Cammy Copa, MD  [facility] ASPIRIN 325 MG tablet Take 325 mg by mouth daily. 03/26/17   [provider]  Cyanocobalamin (B-12 PO) Take by mouth.    [provider]  FLUoxetine (PROZAC) 20 MG capsule Take 20 mg by mouth at bedtime. 03/11/17   [provider]  HYDROcodone-acetaminophen (NORCO/VICODIN) 5-325 MG tablet Take 1 tablet by mouth every 8 (eight) hours as needed for moderate pain. 05/06/17   Cammy Copa, MD  lamoTRIgine (LAMICTAL) 200 MG tablet Take 200 mg by mouth at bedtime. 03/11/17   [provider]   methocarbamol (ROBAXIN) 500 MG tablet Take 1 tablet (500 mg total) by mouth every 8 (eight) hours as needed for muscle spasms. 03/26/17   Cammy Copa, MD  Multiple Vitamins-Minerals (HAIR SKIN NAILS PO) Take by mouth.    [provider]  oxyCODONE (OXY IR/ROXICODONE) 5 MG immediate release tablet Take 1 tablet (5 mg total) by mouth every 4 (four) hours as needed for severe pain. 03/26/17   Cammy Copa, MD  silver sulfADIAZINE (SILVADENE) 1 % cream Apply 1 application topically daily. 04/03/17   Cammy Copa, MD  topiramate (TOPAMAX) 50 MG tablet Take 50 mg by mouth 2 (two) times daily. 01/16/17   [provider]  topiramate (TOPAMAX) 50 MG tablet TAKE 1 TABLET BY MOUTH TWICE A DAY 04/15/17   [provider]  traZODone (DESYREL) 50 MG tablet Take 25 mg by mouth at bedtime as needed for sleep.  02/25/17   [provider]      Allergies    Patient has no known allergies.    Review of Systems   Review of Systems  Physical Exam Updated Vital Signs BP 113/77 (BP Location: Right Arm)   Pulse 69   Temp 97.8 F (36.6 C)   Resp 16   SpO2 100%   Physical Exam Vitals and nursing note reviewed.  Constitutional:      General: She is not in acute distress.    Appearance: She is well-developed.  HENT:  Head: Normocephalic and atraumatic.     Right Ear: External ear normal.     Left Ear: External ear normal.     Nose: Nose normal.  Eyes:     Conjunctiva/sclera: Conjunctivae normal.  Cardiovascular:     Rate and Rhythm: Normal rate and regular rhythm.     Heart sounds: No murmur heard. Pulmonary:     Effort: No respiratory distress.     Breath sounds: No wheezing, rhonchi or rales.  Abdominal:     Palpations: Abdomen is soft.     Tenderness: There is no abdominal tenderness. There is no guarding or rebound.  Musculoskeletal:     Cervical back: Normal range of motion and neck supple.     Right lower leg: No edema.     Left lower  leg: No edema.  Skin:    General: Skin is warm and dry.     Findings: No rash.  Neurological:     General: No focal deficit present.     Mental Status: She is alert. Mental status is at baseline.     Motor: No weakness.  Psychiatric:        Attention and Perception: Attention normal.        Mood and Affect: Mood is depressed. Affect is blunt.        Speech: Speech normal.        Behavior: Behavior normal.        Thought Content: Thought content is paranoid. Thought content does not include homicidal or suicidal ideation. Thought content does not include homicidal or suicidal plan.     ED Results / Procedures / Treatments   Labs (all labs ordered are listed, but only abnormal results are displayed) Labs Reviewed  COMPREHENSIVE METABOLIC PANEL - Abnormal; Notable for the following components:      Result Value   Potassium 3.3 (*)    All other components within normal limits  URINALYSIS, ROUTINE W REFLEX MICROSCOPIC - Abnormal; Notable for the following components:   APPearance HAZY (*)    Leukocytes,Ua MODERATE (*)    Bacteria, UA RARE (*)    All other components within normal limits  ETHANOL  RAPID URINE DRUG SCREEN, HOSP PERFORMED  CBC WITH DIFFERENTIAL/PLATELET    EKG None  Radiology No results found.  Procedures Procedures    Medications Ordered in ED Medications - No data to display  ED Course/ Medical Decision Making/ A&P    Patient seen and examined. History obtained directly from patient.   Labs/EKG: Ordered CBC, CMP, ethanol, UDS..  Imaging: None ordered  Medications/Fluids: None ordered  Initial impression: Depression  1:44 PM Reassessment performed. Patient appears stable.  Labs personally reviewed and interpreted including: CBC unremarkable; CMP slightly low potassium; ethanol negative; UDS negative; UA, no definite infection.  Most current vital signs reviewed and are as follows: BP 113/77 (BP Location: Right Arm)   Pulse 69   Temp 97.8  F (36.6 C)   Resp 16   SpO2 100%   Plan: TTS consult  3:20 PM Patient is medically cleared. Awaiting TTS evaluation.                                  Medical Decision Making Amount and/or Complexity of Data Reviewed Labs: ordered.  Risk Prescription drug management.   Pt with worsening depression, awaiting eval.         Final Clinical Impression(s) / ED Diagnoses Final  diagnoses:  Depression, unspecified depression type  Hypokalemia    Rx / DC Orders ED Discharge Orders     None         Renne Crigler, PA-C 09/19/23 1521    Benjiman Core, MD 09/21/23 1030

## 2023-09-20 DIAGNOSIS — F419 Anxiety disorder, unspecified: Secondary | ICD-10-CM | POA: Diagnosis not present

## 2023-09-20 DIAGNOSIS — F332 Major depressive disorder, recurrent severe without psychotic features: Secondary | ICD-10-CM | POA: Diagnosis not present

## 2023-09-20 LAB — TSH: TSH: 2.206 u[IU]/mL (ref 0.350–4.500)

## 2023-09-20 MED ORDER — BUPROPION HCL ER (XL) 150 MG PO TB24
150.0000 mg | ORAL_TABLET | Freq: Every day | ORAL | Status: DC
  Administered 2023-09-20 – 2023-09-23 (×4): 150 mg via ORAL
  Filled 2023-09-20 (×4): qty 1

## 2023-09-20 NOTE — ED Notes (Signed)
 Patient sitting in bedroom, calm and composed. No acute distress noted. No concerns voiced. Informed patient to notify staff with any needs or assistance. Patient verbalized understanding or agreement. Safety checks in place per facility policy.

## 2023-09-20 NOTE — Tx Team (Signed)
 LCSW and Resident met with patient to assess current mood, affect, physical state, and inquire about needs/goals while here in [facility] and after discharge. Patient reports she presented due to "anxiety, not feeling myself, feeling sad after looking at pictures and my phone when I was happy".  Patient reported increased sadness around December 1 stating she had rambling thoughts about just ending it all by taking her car and driving it into a river.  Patient reports at that time she went to a crisis center in [facility] where she was hospitalized around Christmas. Patient reports the last time that she had these feelings of SI was back in December.  Patient reports she occasionally has thoughts of feeling like it would be better off if she was not around. Patient reports previously being diagnosed with Bipolar II Disorder and reports being prescribed buspar and trazadone by her MD who she sees virtually at [facility] in [place]. Patient reports 1 past encounter of SI about 16 years ago where she went to [facility] [place].  Patient reports this was the only inpatient psychiatric admission.  Patient denies any prior suicidal attempts. Patient currently reports having "no motivation, no get up and go, feeling overwhelmed, anxious, overthinking, lazy, and fearful not wanting to go places".  Patient denied any recent hardships, however reports she was worried about property taxes on her home and if she could afford it. Patient currently denies any suicidal ideations, homicidal ideations, or visual and auditory hallucinations.   Patient reports having support from her 3 daughters and 1 son who was in the [employer] in [place].  Patient reports she lives at home with one of her daughters and her 1-year-old grandson. Patient denies any substance use or alcohol use.  Patient also denies any access to firearms within the house.  Patient reports her ultimate goal is to get stabilized on her medication, and to continue  outpatient services at [facility] in [facility].  Patient aware that [facility] is a 3 to 5-day stay, and is aware that staff is here for additional support as needed.  Patient expressed understanding and appreciation for team's assistance in this time. No concerns to report. LCSW will continue to follow and provide support to the patient while on [facility] unit. Patient has provided permission for LCSW and team to speak with daughter Irina Okelly [phone]. Updates to be provided as received.   LCSW contacted the patient's daughter Mackenna Kamer to gain collateral. Per Daughter, normally the patient does well with the grandchild, taking care of the home, and going out by herself. Recently, the patient has not been driving, she has been very anxious, and has not been eating much. Daughter reports the patient mentioned SI for about 3 days so they took her to the crisis center because she has started pulling out her hair and ruminating about her fingers. Daughter reports the patient was recommended inpatient treatment for a few days to get a handle on her medication and her depression. Daughter reports the patient has bipolar depression which is usually well-managed with medication. Daughter reports the patient used to be on Prozac and Lamictal for depression, however was weaned off due to some dropping spells. Daughter reports her MD thought it was some TD going on with her side effects of the medication. Daughter reports the patient is compliant with her follow up and reports having no concerns with the patient returning home at discharge. Daughter reports the patient does not have access to any weapons or firearms within  the home. 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  Daughter aware that [facility] is a 3-5 day stay and that team will follow up to provide updates regarding anticipated d/c date once received. No other needs were reported by her daughter.     Fernande Boyden, LCSW Clinical Social Worker [facility] Ph: [phone]

## 2023-09-20 NOTE — ED Provider Notes (Signed)
 Behavioral Health Progress Note  Date and Time: 09/20/2023 5:30 PM Name: Melissa Landry MRN:  [id number]  Reason for admission: [name],  68 y/o female with a documented history of bipolar depression, MDD anxiety, and passive suicidal ideations presented to [facility] as a transfer from [facility] for worsening anxiety, paranoia and depression since Dec 2024 and was admitted for crisis stabilization at [facility] on 09/19/2023.   Subjective:   Patient was evaluated on the unit.  She reports she has been experiencing worsening depression since December 2024, with disturbances in sleep.  She describes being unable to shut her mind off and reports her worries keep her up at night.  She also reports low motivation, fatigue, "lazy feelings", and a generalized concern for the stressors going on in her life.  She notes she is unable to control these worries.  The patient identifies support in the form of her family, she has 3 daughters that live close to her and [place].  She lives with one of her daughters and also has a son that is in the [employer], currently stationed in [place].  She notes a previous psychiatric diagnosis of bipolar disorder order, believes she may have been diagnosed with type II but is unsure.  She is unable to recall any history of discrete episodes of expansive energy or mood consistent with hypomanic episodes.  She notes that she has only struggled with depression in her lifetime and is unable to specify any ongoing stressors aside from concerns about her property taxes going up.  On assessment the patient denies any suicidal ideations.  Reports she last had suicidal ideations back in December 2024.  She denies any homicidal ideations.  She denies any auditory or visual hallucinations.   Collateral Call obtained from patient's daughter, Timya Trimmer [phone] Daughter reports the patient has been having a buildup of worsening depression and anxiety in the setting of several stressors.   Daughter believe the patient's depression started around the end of 2024 which coincides with the anniversary of the death of the patient's mother.  More recently, the patient's ex-husband, and father of [name] passed away in 02/15/2025years ago.  The patient has also expressed concern about the property taxes in the home going up and the patient has a current boyfriend who was recently diagnosed with diverticulitis that she has been caring for.  Daughter was brought to [facility] ED in December close to Christmas after she had expressed a desire to drive into a river.  Daughter reports that it had been a long time since the patient had expressed suicidal ideations and this also coincided with an ongoing UTI the patient had been developing.  She reports the patient appeared to have improved after being treated for the UTI at [facility] ED, as well as getting Seroquel nightly for sleep.  Following this she noted the patient continued to not be herself, showing diminished appetite, low energy, and low motivation.  The daughter was following up with Dr. Delice Bison at [facility] for medication management and had been only prescribing the patient BuSpar and trazodone until recently.  She reports that Dr. Geanie Cooley had discontinued the Lamictal and fluoxetine the patient had previously been taking, with daughter explaining she was not given a clear reason why they were stopped.  Daughter confirms the patient has previously been diagnosed with bipolar 2 disorder, but has never identified any symptoms consistent with manic or hypomanic episodes in the past.  Daughter reports, the patient's mother did have a confirmed  diagnosis of bipolar 1 disorder with manic episodes.   Diagnosis:  Final diagnoses:  Severe episode of recurrent major depressive disorder, without psychotic features (HCC)  GAD (generalized anxiety disorder)    Total Time spent with patient: 1.5 hours  Past Psychiatric History:  Previous Dx: bipolar disorder,  anxiety Outpatient medications include: BuSpar 10 mg qAM + 20 qPM, Trazodone 100 mg nightly,  Past: lamotrigine, fluoxetine, alprazolam, seroquel Past Medical History:  No chronic health conditions. No allergies No documented hx of seizures. Patient reports she was previously being prescribed Topamax for weight loss by her OB/GYN. Family Psychiatric  History:  Mother- Bipolar I Disorder Social History:  Patient lives in [place], [place] with her daughter and grandson.  Firearms: denies   Current Medications:  Current Facility-Administered Medications  Medication Dose Route Frequency Provider Last Rate Last Admin   acetaminophen (TYLENOL) tablet 650 mg  650 mg Oral Q6H PRN Sindy Guadeloupe, NP       alum & mag hydroxide-simeth (MAALOX/MYLANTA) 200-200-20 MG/5ML suspension 30 mL  30 mL Oral Q4H PRN Sindy Guadeloupe, NP       buPROPion (WELLBUTRIN XL) 24 hr tablet 150 mg  150 mg Oral Daily Carrion-Carrero, Jaxden Blyden, MD   150 mg at 09/20/23 1038   busPIRone (BUSPAR) tablet 10 mg  10 mg Oral QHS Rex Kras, MD       busPIRone (BUSPAR) tablet 20 mg  20 mg Oral Daily Sindy Guadeloupe, NP   20 mg at 09/20/23 1039   magnesium hydroxide (MILK OF MAGNESIA) suspension 30 mL  30 mL Oral Daily PRN Sindy Guadeloupe, NP       multivitamin with minerals tablet 1 tablet  1 tablet Oral Daily Sindy Guadeloupe, NP   1 tablet at 09/20/23 1039   OLANZapine (ZYPREXA) injection 5 mg  5 mg Intramuscular TID PRN Sindy Guadeloupe, NP       OLANZapine zydis (ZYPREXA) disintegrating tablet 5 mg  5 mg Oral TID PRN Sindy Guadeloupe, NP       traZODone (DESYREL) tablet 100 mg  100 mg Oral QHS Onuoha, Chinwendu V, NP   100 mg at 09/19/23 2232   Current Outpatient Medications  Medication Sig Dispense Refill   busPIRone (BUSPAR) 10 MG tablet Take 10-20 mg by mouth See admin instructions. Take 20 mg by mouth in the morning and 10 mg by mouth in the evening.     Multiple Vitamins-Minerals (MULTIVITAMIN WITH MINERALS) tablet Take 1 tablet by  mouth daily.     traZODone (DESYREL) 100 MG tablet Take 100 mg by mouth at bedtime.     topiramate (TOPAMAX) 50 MG tablet Take 50 mg by mouth 2 (two) times daily.  0    Labs  Lab Results:  Admission on 09/19/2023  Component Date Value Ref Range Status   TSH 09/19/2023 2.206  0.350 - 4.500 uIU/mL Final   Comment: Performed by a 3rd Generation assay with a functional sensitivity of <=0.01 uIU/mL. Performed at [facility], [address], [place], [place] [address]   Admission on 09/19/2023, Discharged on 09/19/2023  Component Date Value Ref Range Status   Sodium 09/19/2023 142  135 - 145 mmol/L Final   Potassium 09/19/2023 3.3 (L)  3.5 - 5.1 mmol/L Final   Chloride 09/19/2023 107  98 - 111 mmol/L Final   CO2 09/19/2023 24  22 - 32 mmol/L Final   Glucose, Bld 09/19/2023 81  70 - 99 mg/dL Final   Glucose reference range applies only to samples taken  after fasting for at least 8 hours.   BUN 09/19/2023 11  8 - 23 mg/dL Final   Creatinine, Ser 09/19/2023 0.86  0.44 - 1.00 mg/dL Final   Calcium 40/04/2724 9.6  8.9 - 10.3 mg/dL Final   Total Protein 36/64/4034 6.8  6.5 - 8.1 g/dL Final   Albumin 74/25/9563 3.7  3.5 - 5.0 g/dL Final   AST 87/56/4332 18  15 - 41 U/L Final   ALT 09/19/2023 15  0 - 44 U/L Final   Alkaline Phosphatase 09/19/2023 62  38 - 126 U/L Final   Total Bilirubin 09/19/2023 0.5  0.0 - 1.2 mg/dL Final   GFR, Estimated 09/19/2023 >60  >60 mL/min Final   Comment: (NOTE) Calculated using the CKD-EPI Creatinine Equation (2021)    Anion gap 09/19/2023 11  5 - 15 Final   Performed at [facility], [address], [place], [place] [address]   Alcohol, Ethyl (B) 09/19/2023 <10  <10 mg/dL Final   Comment: (NOTE) Lowest detectable limit for serum alcohol is 10 mg/dL.  For medical purposes only. Performed at [facility], [address], [place], [place] [address]    Opiates 09/19/2023 NONE DETECTED  NONE DETECTED Final   Cocaine 09/19/2023 NONE DETECTED   NONE DETECTED Final   Benzodiazepines 09/19/2023 NONE DETECTED  NONE DETECTED Final   Amphetamines 09/19/2023 NONE DETECTED  NONE DETECTED Final   Tetrahydrocannabinol 09/19/2023 NONE DETECTED  NONE DETECTED Final   Barbiturates 09/19/2023 NONE DETECTED  NONE DETECTED Final   Comment: (NOTE) DRUG SCREEN FOR MEDICAL PURPOSES ONLY.  IF CONFIRMATION IS NEEDED FOR ANY PURPOSE, NOTIFY LAB WITHIN 5 DAYS.  LOWEST DETECTABLE LIMITS FOR URINE DRUG SCREEN Drug Class                     Cutoff (ng/mL) Amphetamine and metabolites    1000 Barbiturate and metabolites    200 Benzodiazepine                 200 Opiates and metabolites        300 Cocaine and metabolites        300 THC                            50 Performed at [facility], [address], [place], [place] [address]    WBC 09/19/2023 5.4  4.0 - 10.5 K/uL Final   RBC 09/19/2023 4.37  3.87 - 5.11 MIL/uL Final   Hemoglobin 09/19/2023 13.4  12.0 - 15.0 g/dL Final   HCT 07/17/3233 40.7  36.0 - 46.0 % Final   MCV 09/19/2023 93.1  80.0 - 100.0 fL Final   MCH 09/19/2023 30.7  26.0 - 34.0 pg Final   MCHC 09/19/2023 32.9  30.0 - 36.0 g/dL Final   RDW 57/32/2025 13.0  11.5 - 15.5 % Final   Platelets 09/19/2023 238  150 - 400 K/uL Final   nRBC 09/19/2023 0.0  0.0 - 0.2 % Final   Neutrophils Relative % 09/19/2023 68  % Final   Neutro Abs 09/19/2023 3.7  1.7 - 7.7 K/uL Final   Lymphocytes Relative 09/19/2023 25  % Final   Lymphs Abs 09/19/2023 1.3  0.7 - 4.0 K/uL Final   Monocytes Relative 09/19/2023 7  % Final   Monocytes Absolute 09/19/2023 0.4  0.1 - 1.0 K/uL Final   Eosinophils Relative 09/19/2023 0  % Final   Eosinophils  Absolute 09/19/2023 0.0  0.0 - 0.5 K/uL Final   Basophils Relative 09/19/2023 0  % Final   Basophils Absolute 09/19/2023 0.0  0.0 - 0.1 K/uL Final   Immature Granulocytes 09/19/2023 0  % Final   Abs Immature Granulocytes 09/19/2023 0.01  0.00 - 0.07 K/uL Final   Performed at [facility], [address] [place], [place] [address]   Color, Urine 09/19/2023 YELLOW  YELLOW Final   APPearance 09/19/2023 HAZY (A)  CLEAR Final   Specific Gravity, Urine 09/19/2023 1.016  1.005 - 1.030 Final   pH 09/19/2023 6.0  5.0 - 8.0 Final   Glucose, UA 09/19/2023 NEGATIVE  NEGATIVE mg/dL Final   Hgb urine dipstick 09/19/2023 NEGATIVE  NEGATIVE Final   Bilirubin Urine 09/19/2023 NEGATIVE  NEGATIVE Final   Ketones, ur 09/19/2023 NEGATIVE  NEGATIVE mg/dL Final   Protein, ur 60/45/4098 NEGATIVE  NEGATIVE mg/dL Final   Nitrite 11/91/4782 NEGATIVE  NEGATIVE Final   Leukocytes,Ua 09/19/2023 MODERATE (A)  NEGATIVE Final   RBC / HPF 09/19/2023 6-10  0 - 5 RBC/hpf Final   WBC, UA 09/19/2023 6-10  0 - 5 WBC/hpf Final   Bacteria, UA 09/19/2023 RARE (A)  NONE SEEN Final   Squamous Epithelial / HPF 09/19/2023 6-10  0 - 5 /HPF Final   Mucus 09/19/2023 PRESENT   Final   Amorphous Crystal 09/19/2023 PRESENT   Final   Performed at [facility], [address], [place], [place] [address]    Blood Alcohol level:  Lab Results  Component Value Date   ETH <10 09/19/2023    Metabolic Disorder Labs: No results found for: "HGBA1C", "MPG" No results found for: "PROLACTIN" No results found for: "CHOL", "TRIG", "HDL", "CHOLHDL", "VLDL", "LDLCALC"  Therapeutic Lab Levels: No results found for: "LITHIUM" No results found for: "VALPROATE" No results found for: "CBMZ"  Physical Findings   PHQ2-9    Flowsheet Row ED from 09/19/2023 in [facility] Most recent reading at 09/20/2023 10:39 AM ED from 09/19/2023 in [facility] Most recent reading at 09/19/2023  9:36 PM  PHQ-2 Total Score 4 6  PHQ-9 Total Score 14 19      Flowsheet Row ED from 09/19/2023 in [facility] Most recent reading at 09/19/2023 10:56 PM ED from 09/19/2023 in [facility] Emergency Department at [facility] Most recent reading at 09/19/2023 10:46 AM   C-SSRS RISK CATEGORY No Risk No Risk        Musculoskeletal  Strength & Muscle Tone: within normal limits Gait & Station: normal Patient leans: N/A  Psychiatric Specialty Exam  Presentation  General Appearance:  Appropriate for Environment  Eye Contact: Fair  Speech: Clear and Coherent; Slow  Speech Volume: Decreased  Handedness: Not assessed   Mood and Affect  Mood: Hopeless; Depressed  Affect: Congruent   Thought Process  Thought Processes: Linear; Goal Directed; Coherent  Descriptions of Associations:Intact  Orientation:None  Thought Content:Logical     Hallucinations:Hallucinations: None  Ideas of Reference:None  Suicidal Thoughts:Suicidal Thoughts: No  Homicidal Thoughts:Homicidal Thoughts: No   Sensorium  Memory: Immediate Good; Recent Good; Remote Good  Judgment: Fair  Insight: Fair   Art therapist  Concentration: Good  Attention Span: Good  Recall: Good  Fund of Knowledge: Good  Language: Good   Psychomotor Activity  Psychomotor Activity: Psychomotor Activity: Decreased   Assets  Assets: Desire for Improvement; Resilience; Communication Skills   Sleep  Sleep: Sleep: Fair  Number of Hours of Sleep: 6   Nutritional Assessment (For OBS and FBC admissions only) Has the patient had a weight loss or gain of 10 pounds or more in the last 3 months?: No Has the patient had a decrease in food intake/or appetite?: No Does the patient have dental problems?: No Does the patient have eating habits or behaviors that may be indicators of an eating disorder including binging or inducing vomiting?: No Has the patient recently lost weight without trying?: 0 Has the patient been eating poorly because of a decreased appetite?: 0 Malnutrition Screening Tool Score: 0    Physical Exam  Physical Exam Vitals and nursing note reviewed.  HENT:     Head: Normocephalic and atraumatic.  Pulmonary:     Effort:  Pulmonary effort is normal.  Skin:    General: Skin is warm and dry.  Neurological:     General: No focal deficit present.    Review of Systems  All other systems reviewed and are negative.  Blood pressure 92/71, pulse 92, temperature 98.5 F (36.9 C), temperature source Oral, resp. rate 18, SpO2 98%. There is no height or weight on file to calculate BMI.  Treatment Plan Summary: Daily contact with patient to assess and evaluate symptoms and progress in treatment and Medication management   MDD, recurrent severe, without psychosis Wellbutrin was chosen over an SSRI due to the patient's report of low energy, anhedonia, and excessive fatigue.  Based on collateral, patient's daughter had observed that patient had recently been responding well to trazodone to address sleep.  - Start Wellbutrin XL 150 mg daily - Restart home Buspar 10 mg qAM + 20 mg qPM  --May consider discontinuing this medication, as patient was ambivalent in reporting any improvement with BuSpar - Restart home Trazodone 100 mg nightly     --could consider switching to Seroquel, but patient was not sure if this had targeted her sleep when she took in briefly at [facility] and imaging reviewed: CMP showing K+3.3  -On chart review it appears it was repleted back in [facility] with Kcl 40 mEq Ethanol and UDS negative CBC unremarkable UA showing hazy appearance, moderate leukocytes, and bacteria  Dispo: TBD, likely home once stabilized  Signed: Lorri Frederick, MD 09/20/2023 5:30 PM

## 2023-09-20 NOTE — ED Notes (Signed)
 Patient alert & oriented x4. Denies intent to harm self or others when asked. Denies A/VH. Patient denies any physical complaints when asked. No acute distress noted. Scheduled medications administered with no complications. Support and encouragement provided. Routine safety checks conducted per facility protocol. Encouraged patient to notify staff if any thoughts of harm towards self or others arise. Patient verbalizes understanding and agreement.

## 2023-09-20 NOTE — ED Notes (Addendum)
 Pt sitting in the hall way sofa looking sad, upon assessment pt reports feeling depressed, tried talking to her daughter on the phone, hoping, that can help, but she ended up crying. Writer had discussion with pt and provided brief counseling to pt. Pt expressed gratitude and  went to bed.  Pt denies SI/HI/AVH. Pt has no further complain.No acute distress noted. Will continue to monitor for safety and provide support.

## 2023-09-20 NOTE — Group Note (Signed)
 Group Topic: Relapse and Recovery  Group Date: 09/20/2023 Start Time: 2000 End Time: 2100 Facilitators: Darin Engels  Department: [facility]  Number of Participants: 5  Group Focus: abuse issues, acceptance, leisure skills, self-awareness, and substance abuse education Treatment Modality:  Behavior Modification Therapy and Leisure Development Interventions utilized were leisure development, story telling, and support Purpose: enhance coping skills, increase insight, relapse prevention strategies, and trigger / craving management  Name: Melissa Landry Date of Birth: 1956/07/02  MR: [id number]    Level of Participation: pt did not attend relapse/recovery group, here for MDD Quality of Participation: pt did not attend group relapse/recovery, here for MDD Interactions with others: minimal and positive  Mood/Affect: appropriate and positive Triggers (if applicable): n/a Cognition: coherent/clear Progress: Gaining insight Response: pt has positive interactions with other pts and staff.  Plan: patient will be encouraged to attend groups.   Patients Problems:  Patient Active Problem List   Diagnosis Date Noted   Anxiety state 09/19/2023   MDD (major depressive disorder) 09/19/2023

## 2023-09-20 NOTE — ED Notes (Signed)
 Patient resting with eyes closed in no apparent acute distress. Respirations even and unlabored. Environment secured. Safety checks in place according to facility policy.

## 2023-09-20 NOTE — ED Notes (Signed)
 Patient is sleeping. Respirations equal and unlabored, skin warm and dry. No change in assessment or acuity. Routine safety checks conducted according to facility protocol. Will continue to monitor for safety.

## 2023-09-20 NOTE — Group Note (Signed)
 Group Topic: Change and Accountability  Group Date: 09/20/2023 Start Time: 1000 End Time: 1100 Facilitators: Elenor Quinones, NT  Department: [facility]  Number of Participants: 5  Group Focus: daily focus Treatment Modality:  Psychoeducation Interventions utilized were problem solving Purpose: enhance coping skills  Name: DESTRY BEZDEK Date of Birth: 20-Aug-1955  MR: [id number]    Level of Participation: moderate Quality of Participation: attentive Interactions with others: gave feedback Mood/Affect: appropriate Triggers (if applicable): N/A Cognition: concrete Progress: Moderate Response: N/A Plan: follow-up needed  Patients Problems:  Patient Active Problem List   Diagnosis Date Noted   Anxiety state 09/19/2023   MDD (major depressive disorder) 09/19/2023

## 2023-09-20 NOTE — ED Notes (Signed)
 Pt is in the bedroom sleeping comfortably and stable as well. Will continue to monitor for safety.

## 2023-09-21 DIAGNOSIS — F332 Major depressive disorder, recurrent severe without psychotic features: Secondary | ICD-10-CM | POA: Diagnosis not present

## 2023-09-21 DIAGNOSIS — F411 Generalized anxiety disorder: Secondary | ICD-10-CM | POA: Diagnosis not present

## 2023-09-21 DIAGNOSIS — F419 Anxiety disorder, unspecified: Secondary | ICD-10-CM | POA: Diagnosis not present

## 2023-09-21 NOTE — ED Notes (Signed)
 Patient observed/assessed at nursing station. Patient alert and oriented x 4. Affect is flat. Patient denies pain and anxiety. He denies A/V/H. He denies having any thoughts/plan of self harm and harm towards others. Fluid and snack offered. Patient states that appetite has been good throughout the day. Verbalizes no further complaints at this time. Will continue to monitor and support.

## 2023-09-21 NOTE — ED Provider Notes (Signed)
 Behavioral Health Progress Note  Date and Time: 09/21/2023 11:01 PM Name: Melissa Landry MRN:  [id number]  Reason for admission: Melissa Landry,  68 y/o female with a documented history of bipolar depression, MDD anxiety, and passive suicidal ideations presented to [facility] as a transfer from [facility] for worsening anxiety, paranoia and depression since Dec 2024 and was admitted for crisis stabilization at [facility] on 09/19/2023.   Subjective:   Patient was evaluated on the unit.  She reports an "okay" mood today. She did not sleep so well due to the hallway lights. She does report intact appetite. Per primary team's recommendation, we discuss the utility of her BuSpar. She had found benefit in taking the medication, so we will not adjust. Patient reports difficulty with dealing with the transition of aging. She has not seen a therapist in recent times but is open to restarting.   Today, she denies any suicidal or homicidal ideations. She denies any auditory or visual hallucinations.   Collateral Call obtained from patient's daughter, Melissa Landry [phone] by Dr. Sindy Messing: Daughter reports the patient has been having a buildup of worsening depression and anxiety in the setting of several stressors.  Daughter believe the patient's depression started around the end of 2024 which coincides with the anniversary of the death of the patient's mother.  More recently, the patient's ex-husband, and father of Melissa Landry passed away in 02-25-2025years ago.  The patient has also expressed concern about the property taxes in the home going up and the patient has a current boyfriend who was recently diagnosed with diverticulitis that she has been caring for.  Daughter was brought to [facility] ED in December close to Christmas after she had expressed a desire to drive into a river.  Daughter reports that it had been a long time since the patient had expressed suicidal ideations and this also coincided with an ongoing UTI the patient  had been developing.  She reports the patient appeared to have improved after being treated for the UTI at [facility] ED, as well as getting Seroquel nightly for sleep.  Following this she noted the patient continued to not be herself, showing diminished appetite, low energy, and low motivation.  The daughter was following up with Dr. Delice Bison at [facility] for medication management and had been only prescribing the patient BuSpar and trazodone until recently.  She reports that Dr. Geanie Cooley had discontinued the Lamictal and fluoxetine the patient had previously been taking, with daughter explaining she was not given a clear reason why they were stopped.  Daughter confirms the patient has previously been diagnosed with bipolar 2 disorder, but has never identified any symptoms consistent with manic or hypomanic episodes in the past.  Daughter reports, the patient's mother did have a confirmed diagnosis of bipolar 1 disorder with manic episodes.   Diagnosis:  Final diagnoses:  Severe episode of recurrent major depressive disorder, without psychotic features (HCC)  GAD (generalized anxiety disorder)    Total Time spent with patient: 30 minutes  Past Psychiatric History:  Previous Dx: bipolar disorder, anxiety Outpatient medications include: BuSpar 10 mg qAM + 20 qPM, Trazodone 100 mg nightly,  Past: lamotrigine, fluoxetine, alprazolam, seroquel Past Medical History:  No chronic health conditions. No allergies No documented hx of seizures. Patient reports she was previously being prescribed Topamax for weight loss by her OB/GYN. Family Psychiatric  History:  Mother- Bipolar I Disorder Social History:  Patient lives in [place], [place] with her daughter and grandson.  Firearms: denies  Current Medications:  Current Facility-Administered Medications  Medication Dose Route Frequency Provider Last Rate Last Admin   acetaminophen (TYLENOL) tablet 650 mg  650 mg Oral Q6H PRN Sindy Guadeloupe, NP       alum & mag  hydroxide-simeth (MAALOX/MYLANTA) 200-200-20 MG/5ML suspension 30 mL  30 mL Oral Q4H PRN Sindy Guadeloupe, NP       buPROPion (WELLBUTRIN XL) 24 hr tablet 150 mg  150 mg Oral Daily Carrion-Carrero, Margely, MD   150 mg at 09/21/23 0806   busPIRone (BUSPAR) tablet 10 mg  10 mg Oral QHS Rex Kras, MD   10 mg at 09/21/23 2122   busPIRone (BUSPAR) tablet 20 mg  20 mg Oral Daily Sindy Guadeloupe, NP   20 mg at 09/21/23 9528   magnesium hydroxide (MILK OF MAGNESIA) suspension 30 mL  30 mL Oral Daily PRN Sindy Guadeloupe, NP       multivitamin with minerals tablet 1 tablet  1 tablet Oral Daily Sindy Guadeloupe, NP   1 tablet at 09/21/23 0806   OLANZapine (ZYPREXA) injection 5 mg  5 mg Intramuscular TID PRN Sindy Guadeloupe, NP       OLANZapine zydis (ZYPREXA) disintegrating tablet 5 mg  5 mg Oral TID PRN Sindy Guadeloupe, NP       traZODone (DESYREL) tablet 100 mg  100 mg Oral QHS Onuoha, Chinwendu V, NP   100 mg at 09/21/23 2122   Current Outpatient Medications  Medication Sig Dispense Refill   busPIRone (BUSPAR) 10 MG tablet Take 10-20 mg by mouth See admin instructions. Take 20 mg by mouth in the morning and 10 mg by mouth in the evening.     Multiple Vitamins-Minerals (MULTIVITAMIN WITH MINERALS) tablet Take 1 tablet by mouth daily.     traZODone (DESYREL) 100 MG tablet Take 100 mg by mouth at bedtime.     topiramate (TOPAMAX) 50 MG tablet Take 50 mg by mouth 2 (two) times daily.  0    Labs  Lab Results:  Admission on 09/19/2023  Component Date Value Ref Range Status   TSH 09/19/2023 2.206  0.350 - 4.500 uIU/mL Final   Comment: Performed by a 3rd Generation assay with a functional sensitivity of <=0.01 uIU/mL. Performed at [facility], [address] [place], [place] [address]   Admission on 09/19/2023, Discharged on 09/19/2023  Component Date Value Ref Range Status   Sodium 09/19/2023 142  135 - 145 mmol/L Final   Potassium 09/19/2023 3.3 (L)  3.5 - 5.1 mmol/L Final   Chloride 09/19/2023 107   98 - 111 mmol/L Final   CO2 09/19/2023 24  22 - 32 mmol/L Final   Glucose, Bld 09/19/2023 81  70 - 99 mg/dL Final   Glucose reference range applies only to samples taken after fasting for at least 8 hours.   BUN 09/19/2023 11  8 - 23 mg/dL Final   Creatinine, Ser 09/19/2023 0.86  0.44 - 1.00 mg/dL Final   Calcium 40/04/2724 9.6  8.9 - 10.3 mg/dL Final   Total Protein 36/64/4034 6.8  6.5 - 8.1 g/dL Final   Albumin 74/25/9563 3.7  3.5 - 5.0 g/dL Final   AST 87/56/4332 18  15 - 41 U/L Final   ALT 09/19/2023 15  0 - 44 U/L Final   Alkaline Phosphatase 09/19/2023 62  38 - 126 U/L Final   Total Bilirubin 09/19/2023 0.5  0.0 - 1.2 mg/dL Final   GFR, Estimated 09/19/2023 >60  >60 mL/min Final   Comment: (NOTE)  Calculated using the CKD-EPI Creatinine Equation (2021)    Anion gap 09/19/2023 11  5 - 15 Final   Performed at [facility], [address] [place], [place] [address]   Alcohol, Ethyl (B) 09/19/2023 <10  <10 mg/dL Final   Comment: (NOTE) Lowest detectable limit for serum alcohol is 10 mg/dL.  For medical purposes only. Performed at [facility], [address] [place], [place] [address]    Opiates 09/19/2023 NONE DETECTED  NONE DETECTED Final   Cocaine 09/19/2023 NONE DETECTED  NONE DETECTED Final   Benzodiazepines 09/19/2023 NONE DETECTED  NONE DETECTED Final   Amphetamines 09/19/2023 NONE DETECTED  NONE DETECTED Final   Tetrahydrocannabinol 09/19/2023 NONE DETECTED  NONE DETECTED Final   Barbiturates 09/19/2023 NONE DETECTED  NONE DETECTED Final   Comment: (NOTE) DRUG SCREEN FOR MEDICAL PURPOSES ONLY.  IF CONFIRMATION IS NEEDED FOR ANY PURPOSE, NOTIFY LAB WITHIN 5 DAYS.  LOWEST DETECTABLE LIMITS FOR URINE DRUG SCREEN Drug Class                     Cutoff (ng/mL) Amphetamine and metabolites    1000 Barbiturate and metabolites    200 Benzodiazepine                 200 Opiates and metabolites        300 Cocaine and metabolites        300 THC                             50 Performed at [facility], [address] [place], [place] [address]    WBC 09/19/2023 5.4  4.0 - 10.5 K/uL Final   RBC 09/19/2023 4.37  3.87 - 5.11 MIL/uL Final   Hemoglobin 09/19/2023 13.4  12.0 - 15.0 g/dL Final   HCT 18/84/1660 40.7  36.0 - 46.0 % Final   MCV 09/19/2023 93.1  80.0 - 100.0 fL Final   MCH 09/19/2023 30.7  26.0 - 34.0 pg Final   MCHC 09/19/2023 32.9  30.0 - 36.0 g/dL Final   RDW 63/07/6008 13.0  11.5 - 15.5 % Final   Platelets 09/19/2023 238  150 - 400 K/uL Final   nRBC 09/19/2023 0.0  0.0 - 0.2 % Final   Neutrophils Relative % 09/19/2023 68  % Final   Neutro Abs 09/19/2023 3.7  1.7 - 7.7 K/uL Final   Lymphocytes Relative 09/19/2023 25  % Final   Lymphs Abs 09/19/2023 1.3  0.7 - 4.0 K/uL Final   Monocytes Relative 09/19/2023 7  % Final   Monocytes Absolute 09/19/2023 0.4  0.1 - 1.0 K/uL Final   Eosinophils Relative 09/19/2023 0  % Final   Eosinophils Absolute 09/19/2023 0.0  0.0 - 0.5 K/uL Final   Basophils Relative 09/19/2023 0  % Final   Basophils Absolute 09/19/2023 0.0  0.0 - 0.1 K/uL Final   Immature Granulocytes 09/19/2023 0  % Final   Abs Immature Granulocytes 09/19/2023 0.01  0.00 - 0.07 K/uL Final   Performed at [facility], [address] [place], [place] [address]   Color, Urine 09/19/2023 YELLOW  YELLOW Final   APPearance 09/19/2023 HAZY (A)  CLEAR Final   Specific Gravity, Urine 09/19/2023 1.016  1.005 - 1.030 Final   pH 09/19/2023 6.0  5.0 - 8.0 Final   Glucose, UA 09/19/2023 NEGATIVE  NEGATIVE mg/dL Final   Hgb urine dipstick 09/19/2023 NEGATIVE  NEGATIVE Final   Bilirubin Urine 09/19/2023 NEGATIVE  NEGATIVE Final   Ketones, ur 09/19/2023 NEGATIVE  NEGATIVE mg/dL Final   Protein, ur 40/98/1191 NEGATIVE  NEGATIVE mg/dL Final   Nitrite 47/82/9562 NEGATIVE  NEGATIVE Final   Leukocytes,Ua 09/19/2023 MODERATE (A)  NEGATIVE Final   RBC / HPF 09/19/2023 6-10  0 - 5 RBC/hpf Final   WBC, UA 09/19/2023 6-10  0 - 5 WBC/hpf  Final   Bacteria, UA 09/19/2023 RARE (A)  NONE SEEN Final   Squamous Epithelial / HPF 09/19/2023 6-10  0 - 5 /HPF Final   Mucus 09/19/2023 PRESENT   Final   Amorphous Crystal 09/19/2023 PRESENT   Final   Performed at [facility], [address], [place], [place] [address]    Blood Alcohol level:  Lab Results  Component Value Date   ETH <10 09/19/2023    Metabolic Disorder Labs: No results found for: "HGBA1C", "MPG" No results found for: "PROLACTIN" No results found for: "CHOL", "TRIG", "HDL", "CHOLHDL", "VLDL", "LDLCALC"  Therapeutic Lab Levels: No results found for: "LITHIUM" No results found for: "VALPROATE" No results found for: "CBMZ"  Physical Findings   PHQ2-9    Flowsheet Row ED from 09/19/2023 in [facility] Most recent reading at 09/20/2023 10:39 AM ED from 09/19/2023 in [facility] Most recent reading at 09/19/2023  9:36 PM  PHQ-2 Total Score 4 6  PHQ-9 Total Score 14 19      Flowsheet Row ED from 09/19/2023 in [facility] Most recent reading at 09/19/2023 10:56 PM ED from 09/19/2023 in [facility] Department at [facility] Most recent reading at 09/19/2023 10:46 AM  C-SSRS RISK CATEGORY No Risk No Risk        Musculoskeletal  Strength & Muscle Tone: within normal limits Gait & Station: normal Patient leans: N/A  Psychiatric Specialty Exam  Presentation  General Appearance:  Appropriate for Environment; Casual  Eye Contact: Good  Speech: Clear and Coherent; Slow  Speech Volume: Decreased  Handedness: Not assessed   Mood and Affect  Mood: Depressed; Anxious  Affect: Congruent   Thought Process  Thought Processes: Coherent; Linear; Goal Directed  Descriptions of Associations:Intact  Orientation:Full (Time, Place and Person)  Thought Content:Logical; WDL     Hallucinations:Hallucinations: None  Ideas of  Reference:None  Suicidal Thoughts:Suicidal Thoughts: No  Homicidal Thoughts:Homicidal Thoughts: No   Sensorium  Memory: Immediate Good; Recent Good  Judgment: Fair  Insight: Fair   Art therapist  Concentration: Good  Attention Span: Good  Recall: Good  Fund of Knowledge: Good  Language: Good   Psychomotor Activity  Psychomotor Activity: Psychomotor Activity: Normal   Assets  Assets: Communication Skills; Desire for Improvement; Social Support; Resilience   Sleep  Sleep: Sleep: Fair   Nutritional Assessment (For OBS and FBC admissions only) Has the patient had a weight loss or gain of 10 pounds or more in the last 3 months?: No Has the patient had a decrease in food intake/or appetite?: No Does the patient have dental problems?: No Does the patient have eating habits or behaviors that may be indicators of an eating disorder including binging or inducing vomiting?: No Has the patient recently lost weight without trying?: 0 Has the patient been eating poorly because of a decreased appetite?: 0 Malnutrition Screening Tool Score: 0    Physical Exam  Physical Exam Vitals and nursing note reviewed.  HENT:     Head: Normocephalic and atraumatic.  Pulmonary:     Effort: Pulmonary effort is normal.  Skin:    General: Skin is warm and dry.  Neurological:     General: No focal deficit present.    Review of Systems  All other systems reviewed and are negative.  Blood pressure 128/69, pulse 85, temperature 97.9 F (36.6 C), temperature source Oral, resp. rate 18, SpO2 99%. There is no height or weight on file to calculate BMI.  Treatment Plan Summary: Patient continues to report low mood, and is hopeful the new medication will target her sx. After extensive discussion, she does note that she has been having difficulty with aging, and she is open to therapy upon discharge. No medication changes to be made today. LCSW to assist with disposition  planning. Daily contact with patient to assess and evaluate symptoms and progress in treatment and Medication management   MDD, recurrent severe, without psychosis Wellbutrin was chosen over an SSRI due to the patient's report of low energy, anhedonia, and excessive fatigue.  Based on collateral, patient's daughter had observed that patient had recently been responding well to trazodone to address sleep.  - Continue Wellbutrin XL 150 mg daily - Continue home Buspar 10 mg qAM + 20 mg qPM  --Will not adjust nor discontinue this medication, as patient notes improvements in anxiety since starting - Continue home Trazodone 100 mg nightly     --could consider switching to Seroquel, but patient was not sure if this had targeted her sleep when she took in briefly at [facility] and imaging reviewed: CMP showing K+3.3  -On chart review it appears it was repleted back in [facility] with Kcl 40 mEq Ethanol and UDS negative CBC unremarkable UA showing hazy appearance, moderate leukocytes, and bacteria  Dispo: TBD, likely home once stabilized  Signed: Lamar Sprinkles, MD 09/21/2023 11:01 PM

## 2023-09-21 NOTE — Progress Notes (Signed)
Pt is awake, alert and oriented X3. Pt did not voice any complaints of pain or discomfort. No signs of acute distress noted. Administered scheduled meds per order. Pt denies current SI/HI/AVH, plan or intent. Staff will monitor for pt's safety.

## 2023-09-21 NOTE — ED Notes (Signed)
 Patient observed/assessed in room in bed appearing in no immediate distress resting peacefully. Q15 minute checks continued by MHT and nursing staff. Will continue to monitor and support.

## 2023-09-21 NOTE — ED Notes (Signed)
 Pt is in the dayroom watching TV with peers. Pt denies SI/HI/AVH. Pt has no further complain.No acute distress noted. Will continue to monitor for safety and provide support.

## 2023-09-21 NOTE — Progress Notes (Signed)
 Pt was visible in the milieu and was appropriate. No distress noted for concerns voiced. Staff will monitor for pt's safety.

## 2023-09-21 NOTE — Group Note (Signed)
 Group Topic: Balance in Life  Group Date: 09/21/2023 Start Time: 0130 End Time: 0200 Facilitators: Concha Norway, NT  Department: [facility]  Number of Participants: 6  Group Focus: acceptance Treatment Modality:  Behavior Modification Therapy Interventions utilized were clarification Purpose: express feelings  Name: Melissa Landry Date of Birth: August 08, 1955  MR: [id number]    Level of Participation: minimal Quality of Participation: attentive Interactions with others: gave feedback Mood/Affect: bright Triggers (if applicable):  Cognition: concrete Progress: Moderate Response:  Plan: follow-up needed  Patients Problems:  Patient Active Problem List   Diagnosis Date Noted   Anxiety state 09/19/2023   MDD (major depressive disorder) 09/19/2023

## 2023-09-21 NOTE — Group Note (Addendum)
 Group Topic: Wellness  Group Date: 09/21/2023 Start Time: 1730 End Time: 1753 Facilitators: Dickie La, RN  Department: [facility]  Number of Participants: 6  Group Focus: coping skills, relapse prevention, and self-awareness Treatment Modality:  Psychoeducation Interventions utilized were patient education and support Purpose: enhance coping skills and increase insight  Name: DAILEE MANALANG Date of Birth: 03/11/1956  MR: [id number]    Level of Participation: active Quality of Participation: attentive, cooperative, and engaged Interactions with others: gave feedback Mood/Affect: appropriate Triggers (if applicable): none Cognition: coherent/clear, goal directed, and insightful Progress: Gaining insight Response: Pt participated participated in group and gave positive feedback.  Plan: follow-up needed  Patients Problems:  Patient Active Problem List   Diagnosis Date Noted   Anxiety state 09/19/2023   MDD (major depressive disorder) 09/19/2023

## 2023-09-21 NOTE — Group Note (Signed)
 Group Topic: Relaxation  Group Date: 09/21/2023 Start Time: 1915 End Time: 1945 Facilitators: Debe Coder, NT  Department: [facility]  Number of Participants: 7  Group Focus: coping skills Treatment Modality:  Skills Training Interventions utilized were group exercise Purpose: enhance coping skills  Name: Melissa Landry Date of Birth: 19-Jul-1955  MR: [id number]    Level of Participation: active Quality of Participation: engaged Interactions with others: gave feedback Mood/Affect: appropriate Triggers (if applicable):  Cognition: coherent/clear Progress: Significant Response:  Plan: follow-up needed  Patients Problems:  Patient Active Problem List   Diagnosis Date Noted   Anxiety state 09/19/2023   MDD (major depressive disorder) 09/19/2023

## 2023-09-21 NOTE — ED Notes (Signed)
 Patient is sleeping. Respirations equal and unlabored, skin warm and dry. No change in assessment or acuity. Routine safety checks conducted according to facility protocol. Will continue to monitor for safety.

## 2023-09-22 DIAGNOSIS — F419 Anxiety disorder, unspecified: Secondary | ICD-10-CM | POA: Diagnosis not present

## 2023-09-22 DIAGNOSIS — F332 Major depressive disorder, recurrent severe without psychotic features: Secondary | ICD-10-CM | POA: Diagnosis not present

## 2023-09-22 MED ORDER — HYDROXYZINE HCL 25 MG PO TABS
25.0000 mg | ORAL_TABLET | Freq: Once | ORAL | Status: AC | PRN
  Administered 2023-09-22: 25 mg via ORAL
  Filled 2023-09-22: qty 1

## 2023-09-22 NOTE — ED Notes (Signed)
 Patient is sleeping. Respirations equal and unlabored, skin warm and dry. No change in assessment or acuity. Routine safety checks conducted according to facility protocol. Will continue to monitor for safety.

## 2023-09-22 NOTE — ED Notes (Signed)
Patient observed resting quietly, eyes closed. Respirations equal and unlabored. Will continue to monitor for safety.  

## 2023-09-22 NOTE — ED Notes (Signed)
 Pt is in the dayroom watching TV with peers. Pt complained of feeling anxious and nervous, hydroxyzine 25 mg was administered to pt. Pt denies SI/HI/AVH. Pt has no further complain.No acute distress noted. Will continue to monitor for safety and provide support.

## 2023-09-22 NOTE — ED Notes (Addendum)
 Patient A&Ox4. Denies intent to harm self/others when asked. Denies A/VH. Patient denies any physical complaints. No acute distress observed. Routine safety checks conducted according to facility protocol. Patient agreed to notify staff if thoughts of harm toward self or others arise. Will continue to monitor for safety.

## 2023-09-22 NOTE — ED Provider Notes (Signed)
 Behavioral Health Progress Note  Date and Time: 09/22/2023 1:55 PM Name: Melissa Landry MRN:  [id number]  Reason for admission: [name],  68 y/o female with a documented history of bipolar depression, MDD anxiety, and passive suicidal ideations presented to [facility] as a transfer from [facility] for worsening anxiety, paranoia and depression since Dec 2024 and was admitted for crisis stabilization at [facility] on 09/19/2023.   Subjective:   Patient was evaluated on the unit.  She reports an "okay" mood today. She did not sleep so well due to the hallway lights. She does report intact appetite. Per primary team's recommendation, we discuss the utility of her BuSpar. She had found benefit in taking the medication, so we will not adjust. Patient reports difficulty with dealing with the transition of aging. She has not seen a therapist in recent times but is open to restarting.   Today, she denies any suicidal or homicidal ideations. She denies any auditory or visual hallucinations.   Collateral Call obtained from patient's daughter, Jania Steinke [phone] by Dr. Sindy Messing: Daughter reports the patient has been having a buildup of worsening depression and anxiety in the setting of several stressors.  Daughter believe the patient's depression started around the end of 2024 which coincides with the anniversary of the death of the patient's mother.  More recently, the patient's ex-husband, and father of [name] passed away in 2025/02/28years ago.  The patient has also expressed concern about the property taxes in the home going up and the patient has a current boyfriend who was recently diagnosed with diverticulitis that she has been caring for.  Daughter was brought to [facility] ED in December close to Christmas after she had expressed a desire to drive into a river.  Daughter reports that it had been a long time since the patient had expressed suicidal ideations and this also coincided with an ongoing UTI the patient  had been developing.  She reports the patient appeared to have improved after being treated for the UTI at [facility] ED, as well as getting Seroquel nightly for sleep.  Following this she noted the patient continued to not be herself, showing diminished appetite, low energy, and low motivation.  The daughter was following up with Dr. Delice Bison at [facility] for medication management and had been only prescribing the patient BuSpar and trazodone until recently.  She reports that Dr. Geanie Cooley had discontinued the Lamictal and fluoxetine the patient had previously been taking, with daughter explaining she was not given a clear reason why they were stopped.  Daughter confirms the patient has previously been diagnosed with bipolar 2 disorder, but has never identified any symptoms consistent with manic or hypomanic episodes in the past.  Daughter reports, the patient's mother did have a confirmed diagnosis of bipolar 1 disorder with manic episodes.   Diagnosis:  Final diagnoses:  Severe episode of recurrent major depressive disorder, without psychotic features (HCC)  GAD (generalized anxiety disorder)    Total Time spent with patient: 30 minutes  Past Psychiatric History:  Previous Dx: bipolar disorder, anxiety Outpatient medications include: BuSpar 10 mg qAM + 20 qPM, Trazodone 100 mg nightly,  Past: lamotrigine, fluoxetine, alprazolam, seroquel Past Medical History:  No chronic health conditions. No allergies No documented hx of seizures. Patient reports she was previously being prescribed Topamax for weight loss by her OB/GYN. Family Psychiatric  History:  Mother- Bipolar I Disorder Social History:  Patient lives in [place], [place] with her daughter and grandson.  Firearms: denies  Current Medications:  Current Facility-Administered Medications  Medication Dose Route Frequency Provider Last Rate Last Admin   acetaminophen (TYLENOL) tablet 650 mg  650 mg Oral Q6H PRN Sindy Guadeloupe, NP       alum & mag  hydroxide-simeth (MAALOX/MYLANTA) 200-200-20 MG/5ML suspension 30 mL  30 mL Oral Q4H PRN Sindy Guadeloupe, NP       buPROPion (WELLBUTRIN XL) 24 hr tablet 150 mg  150 mg Oral Daily Carrion-Carrero, Margely, MD   150 mg at 09/22/23 0850   busPIRone (BUSPAR) tablet 10 mg  10 mg Oral QHS Rex Kras, MD   10 mg at 09/21/23 2122   busPIRone (BUSPAR) tablet 20 mg  20 mg Oral Daily Sindy Guadeloupe, NP   20 mg at 09/22/23 0850   magnesium hydroxide (MILK OF MAGNESIA) suspension 30 mL  30 mL Oral Daily PRN Sindy Guadeloupe, NP       multivitamin with minerals tablet 1 tablet  1 tablet Oral Daily Sindy Guadeloupe, NP   1 tablet at 09/22/23 0850   OLANZapine (ZYPREXA) injection 5 mg  5 mg Intramuscular TID PRN Sindy Guadeloupe, NP       OLANZapine zydis (ZYPREXA) disintegrating tablet 5 mg  5 mg Oral TID PRN Sindy Guadeloupe, NP       traZODone (DESYREL) tablet 100 mg  100 mg Oral QHS Onuoha, Chinwendu V, NP   100 mg at 09/21/23 2122   Current Outpatient Medications  Medication Sig Dispense Refill   busPIRone (BUSPAR) 10 MG tablet Take 10-20 mg by mouth See admin instructions. Take 20 mg by mouth in the morning and 10 mg by mouth in the evening.     Multiple Vitamins-Minerals (MULTIVITAMIN WITH MINERALS) tablet Take 1 tablet by mouth daily.     traZODone (DESYREL) 100 MG tablet Take 100 mg by mouth at bedtime.     topiramate (TOPAMAX) 50 MG tablet Take 50 mg by mouth 2 (two) times daily.  0    Labs  Lab Results:  Admission on 09/19/2023  Component Date Value Ref Range Status   TSH 09/19/2023 2.206  0.350 - 4.500 uIU/mL Final   Comment: Performed by a 3rd Generation assay with a functional sensitivity of <=0.01 uIU/mL. Performed at [facility], [address] [place], [place] [address]   Admission on 09/19/2023, Discharged on 09/19/2023  Component Date Value Ref Range Status   Sodium 09/19/2023 142  135 - 145 mmol/L Final   Potassium 09/19/2023 3.3 (L)  3.5 - 5.1 mmol/L Final   Chloride 09/19/2023 107   98 - 111 mmol/L Final   CO2 09/19/2023 24  22 - 32 mmol/L Final   Glucose, Bld 09/19/2023 81  70 - 99 mg/dL Final   Glucose reference range applies only to samples taken after fasting for at least 8 hours.   BUN 09/19/2023 11  8 - 23 mg/dL Final   Creatinine, Ser 09/19/2023 0.86  0.44 - 1.00 mg/dL Final   Calcium 60/45/4098 9.6  8.9 - 10.3 mg/dL Final   Total Protein 11/91/4782 6.8  6.5 - 8.1 g/dL Final   Albumin 95/62/1308 3.7  3.5 - 5.0 g/dL Final   AST 65/78/4696 18  15 - 41 U/L Final   ALT 09/19/2023 15  0 - 44 U/L Final   Alkaline Phosphatase 09/19/2023 62  38 - 126 U/L Final   Total Bilirubin 09/19/2023 0.5  0.0 - 1.2 mg/dL Final   GFR, Estimated 09/19/2023 >60  >60 mL/min Final   Comment: (NOTE)  Calculated using the CKD-EPI Creatinine Equation (2021)    Anion gap 09/19/2023 11  5 - 15 Final   Performed at [facility], [address], [place], [place] [address]   Alcohol, Ethyl (B) 09/19/2023 <10  <10 mg/dL Final   Comment: (NOTE) Lowest detectable limit for serum alcohol is 10 mg/dL.  For medical purposes only. Performed at [facility], [address] [place], [place] [address]    Opiates 09/19/2023 NONE DETECTED  NONE DETECTED Final   Cocaine 09/19/2023 NONE DETECTED  NONE DETECTED Final   Benzodiazepines 09/19/2023 NONE DETECTED  NONE DETECTED Final   Amphetamines 09/19/2023 NONE DETECTED  NONE DETECTED Final   Tetrahydrocannabinol 09/19/2023 NONE DETECTED  NONE DETECTED Final   Barbiturates 09/19/2023 NONE DETECTED  NONE DETECTED Final   Comment: (NOTE) DRUG SCREEN FOR MEDICAL PURPOSES ONLY.  IF CONFIRMATION IS NEEDED FOR ANY PURPOSE, NOTIFY LAB WITHIN 5 DAYS.  LOWEST DETECTABLE LIMITS FOR URINE DRUG SCREEN Drug Class                     Cutoff (ng/mL) Amphetamine and metabolites    1000 Barbiturate and metabolites    200 Benzodiazepine                 200 Opiates and metabolites        300 Cocaine and metabolites        300 THC                             50 Performed at [facility], [address], [place], [place] [address]    WBC 09/19/2023 5.4  4.0 - 10.5 K/uL Final   RBC 09/19/2023 4.37  3.87 - 5.11 MIL/uL Final   Hemoglobin 09/19/2023 13.4  12.0 - 15.0 g/dL Final   HCT 84/69/6295 40.7  36.0 - 46.0 % Final   MCV 09/19/2023 93.1  80.0 - 100.0 fL Final   MCH 09/19/2023 30.7  26.0 - 34.0 pg Final   MCHC 09/19/2023 32.9  30.0 - 36.0 g/dL Final   RDW 28/41/3244 13.0  11.5 - 15.5 % Final   Platelets 09/19/2023 238  150 - 400 K/uL Final   nRBC 09/19/2023 0.0  0.0 - 0.2 % Final   Neutrophils Relative % 09/19/2023 68  % Final   Neutro Abs 09/19/2023 3.7  1.7 - 7.7 K/uL Final   Lymphocytes Relative 09/19/2023 25  % Final   Lymphs Abs 09/19/2023 1.3  0.7 - 4.0 K/uL Final   Monocytes Relative 09/19/2023 7  % Final   Monocytes Absolute 09/19/2023 0.4  0.1 - 1.0 K/uL Final   Eosinophils Relative 09/19/2023 0  % Final   Eosinophils Absolute 09/19/2023 0.0  0.0 - 0.5 K/uL Final   Basophils Relative 09/19/2023 0  % Final   Basophils Absolute 09/19/2023 0.0  0.0 - 0.1 K/uL Final   Immature Granulocytes 09/19/2023 0  % Final   Abs Immature Granulocytes 09/19/2023 0.01  0.00 - 0.07 K/uL Final   Performed at [facility] [place] [facility], [address] [place], [place] [address]   Color, Urine 09/19/2023 YELLOW  YELLOW Final   APPearance 09/19/2023 HAZY (A)  CLEAR Final   Specific Gravity, Urine 09/19/2023 1.016  1.005 - 1.030 Final   pH 09/19/2023 6.0  5.0 - 8.0 Final   Glucose, UA 09/19/2023 NEGATIVE  NEGATIVE mg/dL Final   Hgb urine dipstick 09/19/2023 NEGATIVE  NEGATIVE Final   Bilirubin Urine 09/19/2023 NEGATIVE  NEGATIVE Final   Ketones, ur 09/19/2023 NEGATIVE  NEGATIVE mg/dL Final   Protein, ur 64/40/3474 NEGATIVE  NEGATIVE mg/dL Final   Nitrite 25/95/6387 NEGATIVE  NEGATIVE Final   Leukocytes,Ua 09/19/2023 MODERATE (A)  NEGATIVE Final   RBC / HPF 09/19/2023 6-10  0 - 5 RBC/hpf Final   WBC, UA 09/19/2023 6-10  0 - 5 WBC/hpf  Final   Bacteria, UA 09/19/2023 RARE (A)  NONE SEEN Final   Squamous Epithelial / HPF 09/19/2023 6-10  0 - 5 /HPF Final   Mucus 09/19/2023 PRESENT   Final   Amorphous Crystal 09/19/2023 PRESENT   Final   Performed at [facility], [address], [place], [place] [address]    Blood Alcohol level:  Lab Results  Component Value Date   ETH <10 09/19/2023    Metabolic Disorder Labs: No results found for: "HGBA1C", "MPG" No results found for: "PROLACTIN" No results found for: "CHOL", "TRIG", "HDL", "CHOLHDL", "VLDL", "LDLCALC"  Therapeutic Lab Levels: No results found for: "LITHIUM" No results found for: "VALPROATE" No results found for: "CBMZ"  Physical Findings   PHQ2-9    Flowsheet Row ED from 09/19/2023 in [facility] Most recent reading at 09/20/2023 10:39 AM ED from 09/19/2023 in [facility] Most recent reading at 09/19/2023  9:36 PM  PHQ-2 Total Score 4 6  PHQ-9 Total Score 14 19      Flowsheet Row ED from 09/19/2023 in [facility] Most recent reading at 09/19/2023 10:56 PM ED from 09/19/2023 in [facility] Emergency Department at [facility] Most recent reading at 09/19/2023 10:46 AM  C-SSRS RISK CATEGORY No Risk No Risk        Musculoskeletal  Strength & Muscle Tone: within normal limits Gait & Station: normal Patient leans: N/A  Psychiatric Specialty Exam  Presentation  General Appearance:  Appropriate for Environment; Casual  Eye Contact: Good  Speech: Clear and Coherent; Slow  Speech Volume: Decreased  Handedness: Not assessed   Mood and Affect  Mood: Depressed; Anxious  Affect: Congruent   Thought Process  Thought Processes: Coherent; Linear; Goal Directed  Descriptions of Associations:Intact  Orientation:Full (Time, Place and Person)  Thought Content:Logical; WDL     Hallucinations:Hallucinations: None  Ideas of  Reference:None  Suicidal Thoughts:Suicidal Thoughts: No  Homicidal Thoughts:Homicidal Thoughts: No   Sensorium  Memory: Immediate Good; Recent Good  Judgment: Fair  Insight: Fair   Art therapist  Concentration: Good  Attention Span: Good  Recall: Good  Fund of Knowledge: Good  Language: Good   Psychomotor Activity  Psychomotor Activity: Psychomotor Activity: Normal   Assets  Assets: Communication Skills; Desire for Improvement; Social Support; Resilience   Sleep  Sleep: Sleep: Fair   No data recorded   Physical Exam  Physical Exam Vitals and nursing note reviewed.  HENT:     Head: Normocephalic and atraumatic.  Pulmonary:     Effort: Pulmonary effort is normal.  Skin:    General: Skin is warm and dry.  Neurological:     General: No focal deficit present.    Review of Systems  All other systems reviewed and are negative.  Blood pressure (!) 113/58, pulse 72, temperature 97.7 F (36.5 C), temperature source Oral, resp. rate 16, SpO2 98%. There is no height or weight on file to calculate BMI.  Treatment Plan Summary: Patient continues to report low mood, and is hopeful the  new medication will target her sx. After extensive discussion, she does note that she has been having difficulty with aging, and she is open to therapy upon discharge. No medication changes to be made today. LCSW to assist with disposition planning. Daily contact with patient to assess and evaluate symptoms and progress in treatment and Medication management   MDD, recurrent severe, without psychosis Wellbutrin was chosen over an SSRI due to the patient's report of low energy, anhedonia, and excessive fatigue.  Based on collateral, patient's daughter had observed that patient had recently been responding well to trazodone to address sleep.  - Continue Wellbutrin XL 150 mg daily - Continue home Buspar 10 mg qAM + 20 mg qPM  --Will not adjust nor discontinue this  medication, as patient notes improvements in anxiety since starting - Continue home Trazodone 100 mg nightly     --could consider switching to Seroquel, but patient was not sure if this had targeted her sleep when she took in briefly at [facility] and imaging reviewed: CMP showing K+3.3  -On chart review it appears it was repleted back in [facility] with Kcl 40 mEq Ethanol and UDS negative CBC unremarkable UA showing hazy appearance, moderate leukocytes, and bacteria  Dispo: TBD, likely home once stabilized  Signed: Lamar Sprinkles, MD 09/22/2023 1:55 PM

## 2023-09-22 NOTE — Group Note (Signed)
 Group Topic: Balance in Life  Group Date: 09/22/2023 Start Time: 1200 End Time: 1230 Facilitators: Concha Norway, NT  Department: [facility]  Number of Participants: 7  Group Focus: acceptance Treatment Modality:  Behavior Modification Therapy Interventions utilized were clarification Purpose: enhance coping skills  Name: KAVYA HAAG Date of Birth: 1955/11/07  MR: [id number]    Level of Participation: minimal Quality of Participation: attentive Interactions with others:   Mood/Affect: anxious Triggers (if applicable):   Cognition: concrete Progress: Minimal Response:   Plan: follow-up needed  Patients Problems:  Patient Active Problem List   Diagnosis Date Noted   Anxiety state 09/19/2023   MDD (major depressive disorder) 09/19/2023

## 2023-09-23 DIAGNOSIS — F419 Anxiety disorder, unspecified: Secondary | ICD-10-CM | POA: Diagnosis not present

## 2023-09-23 DIAGNOSIS — F332 Major depressive disorder, recurrent severe without psychotic features: Secondary | ICD-10-CM | POA: Diagnosis not present

## 2023-09-23 MED ORDER — HYDROXYZINE HCL 25 MG PO TABS: 25.0000 mg | ORAL_TABLET | Freq: Three times a day (TID) | ORAL | Status: DC | PRN

## 2023-09-23 MED ORDER — HYDROXYZINE HCL 25 MG PO TABS: 25.0000 mg | ORAL_TABLET | Freq: Two times a day (BID) | ORAL | 0 refills | Status: AC | PRN

## 2023-09-23 MED ORDER — BUPROPION HCL ER (XL) 150 MG PO TB24: 150.0000 mg | ORAL_TABLET | Freq: Every day | ORAL | 0 refills | Status: AC

## 2023-09-23 MED ORDER — ADULT MULTIVITAMIN W/MINERALS CH: 1.0000 | ORAL_TABLET | Freq: Every day | ORAL | Status: AC

## 2023-09-23 NOTE — Group Note (Signed)
 Group Topic: Emotional Regulation  Group Date: 09/23/2023 Start Time: 1205 End Time: 1230 Facilitators: Concha Norway, NT  Department: [facility]  Number of Participants: 4  Group Focus: acceptance Treatment Modality:  Eclectic Therapy Interventions utilized were clarification Purpose: express irrational fears  Name: Melissa Landry Date of Birth: Nov 01, 1955  MR: [id number]    Level of Participation: moderate Quality of Participation: attentive Interactions with others: intrusive Mood/Affect: appropriate Triggers (if applicable):   Cognition: concrete Progress: Significant Response:   Plan: follow-up needed  Patients Problems:  Patient Active Problem List   Diagnosis Date Noted   Anxiety state 09/19/2023   MDD (major depressive disorder) 09/19/2023

## 2023-09-23 NOTE — ED Notes (Signed)
 Pt in the shower at the current. Pt denies SI, HI, pain and AVH. No s/s of distress noted or voiced. Will continue to monitor for safety and report any changes.

## 2023-09-23 NOTE — Group Note (Signed)
 Group Topic: Social Support  Group Date: 09/23/2023 Start Time: 1030 End Time: 1106 Facilitators: Hally Colella, Jacklynn Barnacle, RN  Department: [facility]  Number of Participants: 6  Group Focus: abuse issues Treatment Modality:  Interpersonal Therapy Interventions utilized were exploration and support Purpose: improve communication skills  Name: Melissa Landry Date of Birth: January 22, 1956  MR: [id number]    Level of Participation: moderate Quality of Participation: attentive and cooperative Interactions with others: gave feedback Mood/Affect: appropriate Triggers (if applicable): None noted Cognition: coherent/clear and logical Progress: Gaining insight Response: Patient actively participated in group Plan: patient will be encouraged to continue to attend groups  Patients Problems:  Patient Active Problem List   Diagnosis Date Noted   Anxiety state 09/19/2023   MDD (major depressive disorder) 09/19/2023

## 2023-09-23 NOTE — ED Notes (Signed)
 Pt discharged with AVS. AVS & safety plan reviewed prior to discharge with RN. Pt alert, oriented, and ambulatory. Safety maintained.

## 2023-09-23 NOTE — ED Notes (Signed)
 Patient alert & oriented x4. Denies intent to harm self or others when asked. Denies A/VH. Patient denies any physical complaints when asked. No acute distress noted. Scheduled medications administered with no complications. Support and encouragement provided. Routine safety checks conducted per facility protocol. Encouraged patient to notify staff if any thoughts of harm towards self or others arise. Patient verbalizes understanding and agreement.

## 2023-09-23 NOTE — Group Note (Signed)
 Group Topic: Recovery Basics  Group Date: 09/23/2023 Start Time: 1120 End Time: 1130 Facilitators: Wonda Cheng, LPN  Department: [facility]  Number of Participants: 1  Group Focus: discharge education Treatment Modality:  Individual Therapy Interventions utilized were patient education Purpose: increase insight  Name: Melissa Landry Date of Birth: 06-26-1956  MR: [id number]    Level of Participation: active Quality of Participation: attentive, cooperative, and motivated Interactions with others: gave feedback Mood/Affect: appropriate and positive Triggers (if applicable): n/a Cognition: coherent/clear Progress: Gaining insight Response: appropriate response to therapy Plan: patient will be encouraged to refer to suicide safety plan as needed or call hotline.  Patients Problems:  Patient Active Problem List   Diagnosis Date Noted   Anxiety state 09/19/2023   MDD (major depressive disorder) 09/19/2023

## 2023-09-23 NOTE — ED Provider Notes (Signed)
 FBC/OBS ASAP Discharge Summary  Date and Time: 09/23/2023 1:07 PM  Name: Melissa Landry  MRN:  [id number]   Discharge Diagnoses:  Final diagnoses:  Severe episode of recurrent major depressive disorder, without psychotic features (HCC)  GAD (generalized anxiety disorder)    Stay Summary:  Melissa Landry,  68 y/o female with a documented history of bipolar depression, MDD anxiety, and passive suicidal ideations presented to [facility] as a transfer from [facility] for worsening anxiety, paranoia and depression since Dec 2024 and was admitted for crisis stabilization at [facility] on 09/19/2023.   During the patient's hospitalization at the [facility], patient had extensive initial psychiatric evaluation, with daily follow-up assessments focused on detoxification management.  Patient's medications adjusted during hospitalization:   MDD, recurrent severe, without psychosis Wellbutrin was chosen over an SSRI due to the patient's report of low energy, anhedonia, and excessive fatigue.  Based on collateral, patient's daughter had observed that patient had recently been responding well to trazodone to address sleep.  - Started and continued  Wellbutrin XL 150 mg daily - Restarted and continued home Buspar 10 mg qAM + 20 mg qPM - Restarted and continued home Trazodone 100 mg nightly for insomnia  Patient also reported benefiting form nightly dose of hydroxyzine 25 mg for sleep. She believes her sleep remained poor during this hospitalization due to the lights on the unit. Patient was discharged with 30 day supply of this medication and instructed she could take it as needed to address any lingering insomnia in the home.   Patient's care was discussed during the interdisciplinary team meeting every day during the hospitalization.  The patient denies having side effects to prescribed psychiatric medication.  Gradually, patient started adjusting to milieu. The patient was evaluated each day by a clinical provider to  ascertain response to treatment. Improvement was noted by the patient's report of decreasing symptoms, improved sleep and appetite, affect, medication tolerance, behavior, and participation in unit programming.  Patient was asked each day to complete a self inventory noting mood, mental status, pain, new symptoms, anxiety and concerns.    Symptoms were reported as significantly decreased or resolved completely by discharge.   On day of discharge, the patient reports that their mood is stable. The patient denied having suicidal thoughts for more than 48 hours prior to discharge.  Patient denies having homicidal thoughts.  Patient denies having auditory hallucinations.  Patient denies any visual hallucinations or other symptoms of psychosis. The patient was motivated to continue taking medication with a goal of continued improvement in mental health.   Labs were reviewed with the patient, and abnormal results were discussed with the patient.  The patient is able to verbalize their individual safety plan to this provider.  # It is recommended to the patient to continue psychiatric medications as prescribed, after discharge from the hospital.    # It is recommended to the patient to follow up with their outpatient psychiatric provider and PCP.  # It was discussed with the patient, the impact of alcohol, drugs, tobacco have been there overall psychiatric and medical wellbeing, and total abstinence from substance use was recommended.  # Prescriptions provided or sent directly to preferred pharmacy at discharge. Patient agreeable to plan. Given opportunity to ask questions. Appears to feel comfortable with discharge.    # In the event of worsening symptoms, the patient is instructed to call the crisis hotline, 911 and or go to the nearest ED for appropriate evaluation and treatment of symptoms. To follow-up with  primary care provider for other medical issues, concerns and or health care needs  # Patient  was discharged Home with a plan to follow up as noted below.     Total Time spent with patient: 15 minutes Past Psychiatric History:  Patient has outpatient follow up with Dr. Delice Bison at [facility] for medication management Previous Dx: bipolar disorder, anxiety Outpatient medications include: BuSpar 10 mg qAM + 20 qPM, Trazodone 100 mg nightly,  Past: lamotrigine, fluoxetine, alprazolam, seroquel Past Medical History:  No chronic health conditions. No allergies No documented hx of seizures. Patient reports she was previously being prescribed Topamax for weight loss by her OB/GYN. Family Psychiatric  History:  Mother- Bipolar I Disorder Social History:  Patient lives in [place], [place] with her daughter and grandson.  Firearms: denies  Current Medications:  Current Facility-Administered Medications  Medication Dose Route Frequency Provider Last Rate Last Admin   acetaminophen (TYLENOL) tablet 650 mg  650 mg Oral Q6H PRN Sindy Guadeloupe, NP       alum & mag hydroxide-simeth (MAALOX/MYLANTA) 200-200-20 MG/5ML suspension 30 mL  30 mL Oral Q4H PRN Sindy Guadeloupe, NP       buPROPion (WELLBUTRIN XL) 24 hr tablet 150 mg  150 mg Oral Daily Carrion-Carrero, Corbin Hott, MD   150 mg at 09/23/23 0845   busPIRone (BUSPAR) tablet 10 mg  10 mg Oral QHS Rex Kras, MD   10 mg at 09/22/23 2110   busPIRone (BUSPAR) tablet 20 mg  20 mg Oral Daily Sindy Guadeloupe, NP   20 mg at 09/23/23 0845   hydrOXYzine (ATARAX) tablet 25 mg  25 mg Oral TID PRN Lorri Frederick, MD       magnesium hydroxide (MILK OF MAGNESIA) suspension 30 mL  30 mL Oral Daily PRN Sindy Guadeloupe, NP       multivitamin with minerals tablet 1 tablet  1 tablet Oral Daily Sindy Guadeloupe, NP   1 tablet at 09/23/23 0845   OLANZapine (ZYPREXA) injection 5 mg  5 mg Intramuscular TID PRN Sindy Guadeloupe, NP       OLANZapine zydis (ZYPREXA) disintegrating tablet 5 mg  5 mg Oral TID PRN Sindy Guadeloupe, NP       traZODone (DESYREL) tablet 100 mg  100  mg Oral QHS Onuoha, Chinwendu V, NP   100 mg at 09/22/23 2111   Current Outpatient Medications  Medication Sig Dispense Refill   busPIRone (BUSPAR) 10 MG tablet Take 10-20 mg by mouth See admin instructions. Take 20 mg by mouth in the morning and 10 mg by mouth in the evening.     traZODone (DESYREL) 100 MG tablet Take 100 mg by mouth at bedtime.     [START ON 09/24/2023] buPROPion (WELLBUTRIN XL) 150 MG 24 hr tablet Take 1 tablet (150 mg total) by mouth daily. 30 tablet 0   hydrOXYzine (ATARAX) 25 MG tablet Take 1 tablet (25 mg total) by mouth 2 (two) times daily as needed for anxiety. 60 tablet 0   [START ON 09/24/2023] Multiple Vitamin (MULTIVITAMIN WITH MINERALS) TABS tablet Take 1 tablet by mouth daily.      PTA Medications:  Facility Ordered Medications  Medication   [COMPLETED] potassium chloride SA (KLOR-CON M) CR tablet 40 mEq   acetaminophen (TYLENOL) tablet 650 mg   alum & mag hydroxide-simeth (MAALOX/MYLANTA) 200-200-20 MG/5ML suspension 30 mL   magnesium hydroxide (MILK OF MAGNESIA) suspension 30 mL   OLANZapine zydis (ZYPREXA) disintegrating tablet 5 mg   OLANZapine (ZYPREXA) injection 5 mg  busPIRone (BUSPAR) tablet 20 mg   multivitamin with minerals tablet 1 tablet   busPIRone (BUSPAR) tablet 10 mg   traZODone (DESYREL) tablet 100 mg   buPROPion (WELLBUTRIN XL) 24 hr tablet 150 mg   [COMPLETED] hydrOXYzine (ATARAX) tablet 25 mg   hydrOXYzine (ATARAX) tablet 25 mg   PTA Medications  Medication Sig   [START ON 09/24/2023] buPROPion (WELLBUTRIN XL) 150 MG 24 hr tablet Take 1 tablet (150 mg total) by mouth daily.   hydrOXYzine (ATARAX) 25 MG tablet Take 1 tablet (25 mg total) by mouth 2 (two) times daily as needed for anxiety.   [START ON 09/24/2023] Multiple Vitamin (MULTIVITAMIN WITH MINERALS) TABS tablet Take 1 tablet by mouth daily.       09/23/2023   11:22 AM 09/20/2023   10:39 AM 09/19/2023    9:36 PM  Depression screen PHQ 2/9  Decreased Interest 1 2 3   Down,  Depressed, Hopeless 1 2 3   PHQ - 2 Score 2 4 6   Altered sleeping 1 1 1   Tired, decreased energy 2 2 2   Change in appetite 1 2 2   Feeling bad or failure about yourself  0 2 2  Trouble concentrating 1 2 2   Moving slowly or fidgety/restless 1 1 2   Suicidal thoughts 0 0 2  PHQ-9 Score 8 14 19   Difficult doing work/chores Somewhat difficult Very difficult     Flowsheet Row ED from 09/19/2023 in [facility] Most recent reading at 09/19/2023 10:56 PM ED from 09/19/2023 in [facility] Emergency Department at [facility] Most recent reading at 09/19/2023 10:46 AM  C-SSRS RISK CATEGORY No Risk No Risk       Musculoskeletal  Strength & Muscle Tone: within normal limits Gait & Station: normal Patient leans: N/A  Psychiatric Specialty Exam  Presentation  General Appearance:  Appropriate for Environment  Eye Contact: Good  Speech: Clear and Coherent; Slow  Speech Volume: Decreased  Handedness: -- (not assessed)   Mood and Affect  Mood: Depressed ("Alright")  Affect: Congruent; Full Range   Thought Process  Thought Processes: Linear  Descriptions of Associations:Intact  Orientation:None  Thought Content:Logical     Hallucinations:Hallucinations: None  Ideas of Reference:None  Suicidal Thoughts:Suicidal Thoughts: No  Homicidal Thoughts:Homicidal Thoughts: No   Sensorium  Memory: Immediate Fair; Recent Fair; Remote Fair  Judgment: Fair  Insight: Fair   Art therapist  Concentration: Fair  Attention Span: Fair  Recall: Fair  Fund of Knowledge: Fair  Language: Fair   Psychomotor Activity  Psychomotor Activity:Psychomotor Activity: Normal   Assets  Assets: Desire for Improvement; Resilience; Communication Skills   Sleep  Sleep:Sleep: Fair    Physical Exam  Physical Exam Vitals and nursing note reviewed.  Constitutional:      General: She is not in acute distress.    Appearance: She  is not ill-appearing.  HENT:     Head: Normocephalic and atraumatic.  Eyes:     Extraocular Movements: Extraocular movements intact.     Conjunctiva/sclera: Conjunctivae normal.  Pulmonary:     Effort: Pulmonary effort is normal. No respiratory distress.  Skin:    General: Skin is warm and dry.  Neurological:     General: No focal deficit present.    Review of Systems  All other systems reviewed and are negative.  Blood pressure 117/89, pulse 82, temperature 97.7 F (36.5 C), temperature source Oral, resp. rate 18, SpO2 97%. There is no height or weight on file to calculate BMI.  Demographic Factors:  Age 20 or older and Unemployed  Loss Factors: Financial problems/change in socioeconomic status  Historical Factors: Family history of mental illness or substance abuse and Anniversary of important loss  Risk Reduction Factors:   Sense of responsibility to family, Living with another person, especially a relative, Positive social support, Positive therapeutic relationship, and Positive coping skills or problem solving skills  Continued Clinical Symptoms:  Previous Psychiatric Diagnoses and Treatments  Cognitive Features That Contribute To Risk:  None    Suicide Risk:  Mild:  Suicidal ideation of limited frequency, intensity, duration, and specificity.  There are no identifiable plans, no associated intent, mild dysphoria and related symptoms, good self-control (both objective and subjective assessment), few other risk factors, and identifiable protective factors, including available and accessible social support.  Plan Of Care/Follow-up recommendations:  Activity: as tolerated  Diet: heart healthy  Other: -Follow-up with your outpatient psychiatric provider -instructions on appointment date, time, and address (location) are provided to you in discharge paperwork.  -Take your psychiatric medications as prescribed at discharge - instructions are provided to you in the  discharge paperwork  -If you are prescribed an atypical antipsychotic medication, we recommend that your outpatient psychiatrist follow routine screening for side effects within 3 months of discharge, including monitoring: AIMS scale, height, weight, blood pressure, fasting lipid panel, HbA1c, and fasting blood sugar.   -Recommend total abstinence from alcohol, tobacco, and other illicit drug use at discharge.   -If your psychiatric symptoms recur, worsen, or if you have side effects to your psychiatric medications, call your outpatient psychiatric provider, 911, [phone] or go to the nearest emergency department.  -If suicidal thoughts occur, immediately call your outpatient psychiatric provider, 911, [phone] or go to the nearest emergency department.   Disposition: Home with outpatient services arranged, patient ultimately declined offer for IOP.    Signed: Lorri Frederick, MD 09/23/2023, 1:07 PM

## 2023-09-23 NOTE — ED Notes (Signed)
 Patient sitting in bedroom calm and composed. No acute distress noted. No concerns voiced. Informed patient to notify staff with any needs or assistance. Patient verbalized understanding or agreement. Safety checks in place per facility policy.

## 2023-09-23 NOTE — ED Notes (Signed)
 Patient is sleeping. Respirations equal and unlabored, skin warm and dry. No change in assessment or acuity. Routine safety checks conducted according to facility protocol. Will continue to monitor for safety.

## 2023-09-23 NOTE — ED Notes (Signed)
 Patient at phone, calm and composed. No acute distress noted. No concerns voiced. Informed patient to notify staff with any needs or assistance. Patient verbalized understanding or agreement. Safety checks in place per facility policy.

## 2023-09-23 NOTE — Discharge Instructions (Signed)
 Patient will resume outpatient services with [facility]: Address: [address] [place], [place] [address]; Phone: [phone]  [facility] [address], [place], [address] [phone] phone  New Patient Assessment/Therapy Walk-Ins:  Monday and Wednesday: 8 am until slots are full. Every 1st and 2nd Fridays of the month: 1 pm - 5 pm.  NO ASSESSMENT/THERAPY WALK-INS ON TUESDAYS OR THURSDAYS  New Patient Assessment/Medication Management Walk-Ins:  Monday - Friday:  8 am - 11 am.  For all walk-ins, we ask that you arrive by 7:30 am because patients will be seen in the order of arrival.  Availability is limited; therefore, you may not be seen on the same day that you walk-in.  Our goal is to serve and meet the needs of our community to the best of our [place] ability.  SUBSTANCE USE TREATMENT for Medicaid and State Funded/IPRS  [facility] [address], [place], [address] [phone] phone NOTE: [facility] is no longer offering IOP services.  Serves those who are low-income or have no insurance.  [facility] [address], [place], [place], [address] [phone] phone [phone] fax NOTE: Does have Substance Abuse-Intensive Outpatient Program [facility]) as well as transitional housing if eligible.  [facility] [address] [place], [place], [address] [phone] phone [phone] fax  [facility] [phone] [place], [place], [address] [phone] phone [phone] fax  HALFWAY HOUSES:  [facility] [phone]  [employer].oxfordvacancies.com  12 STEP PROGRAMS:  Alcoholics Anonymous of [place] Software[employer]alet.be  Narcotics Anonymous of [place] [employer]ct.dk  Al-Anon of [place], [place] www.[place]alanon.org/find-meetings.html  Nar-Anon https://nar-anon.org/find-a-meetin  List of Residential  placements:   [facility] in [place]: [phone]  [facility]: [phone]  [place], [place] [phone]: Female and female facility; 30-day program: (uninsured and Medicaid such as Laurena Bering, [place], [place], partners)  [facility]: [phone]; men and women's facility; 28 days; Can have Medicaid tailored plan Tour manager or Partners)  [facility]: [phone] Karoline Caldwell or Larita Fife; 28 day program; must be fully detox; tailored Medicaid or no insurance  [address] in [place], [place]; [phone]; 28 day all males program; no insurance accepted  [facility] in [place]: Gabriel Rung [phone] (no insurance or Medicaid only); 90 days; outpatient services but provide housing in apartments [place]  [facility] Admission: [phone]: Patient must complete phone screening for placement: [place], [place]; 6 month program; uninsured, Medicaid, and [employer].   [facility]: no insurance required; [phone]  [facility]: [phone]; Intake: Molly Maduro; Must fill out application online; Alecia Lemming Delay [phone] x [address] [facility] in [place], [place]: [phone]; Admissions Coordinators Mr. Maurine Minister or Barron Alvine; 90 day program.  [facility]: [place], [place] [phone]; Co-Ed 9 month to a year program; Online application; Men entry fee is $500 (6-35months);  [employer]: [address] [place], [place] [address]; no fee or insurance required; minimum of 2 years; Highly structured; work based; Intake Coordinator is Thayer Ohm [phone]  [facility] in [place], [place]: [phone]; Fax number is [phone]; website: www.[facility].org; Requires 3-6 page autobiography; 2 year program (18 months and then 46month transitional housing); Admission fee is $300; no insurance needed; work Automotive 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 Free program; There is an initial $300 fee however, they are willing to work with patients regarding that. Application is  online.  [facility]: Admissions [phone] Doran Heater ext [phone]; Any 7-90 day program is out of pocket; 12 month program is free of charge; there is a $275 entry fee; Patient is responsible for own transportation

## 2023-09-26 DIAGNOSIS — F5101 Primary insomnia: Secondary | ICD-10-CM | POA: Diagnosis not present

## 2023-10-01 DIAGNOSIS — L0231 Cutaneous abscess of buttock: Secondary | ICD-10-CM | POA: Diagnosis not present

## 2023-10-04 DIAGNOSIS — L0231 Cutaneous abscess of buttock: Secondary | ICD-10-CM | POA: Diagnosis not present

## 2023-10-24 DIAGNOSIS — F5101 Primary insomnia: Secondary | ICD-10-CM | POA: Diagnosis not present

## 2023-11-26 DIAGNOSIS — F5101 Primary insomnia: Secondary | ICD-10-CM | POA: Diagnosis not present

## 2023-12-03 DIAGNOSIS — Z1231 Encounter for screening mammogram for malignant neoplasm of breast: Secondary | ICD-10-CM | POA: Diagnosis not present

## 2023-12-25 DIAGNOSIS — H2513 Age-related nuclear cataract, bilateral: Secondary | ICD-10-CM | POA: Diagnosis not present

## 2024-01-21 DIAGNOSIS — F5101 Primary insomnia: Secondary | ICD-10-CM | POA: Diagnosis not present

## 2024-04-17 DIAGNOSIS — R7301 Impaired fasting glucose: Secondary | ICD-10-CM | POA: Diagnosis not present

## 2024-04-17 DIAGNOSIS — R5383 Other fatigue: Secondary | ICD-10-CM | POA: Diagnosis not present

## 2024-04-17 DIAGNOSIS — Z13220 Encounter for screening for lipoid disorders: Secondary | ICD-10-CM | POA: Diagnosis not present

## 2024-04-17 DIAGNOSIS — Z1329 Encounter for screening for other suspected endocrine disorder: Secondary | ICD-10-CM | POA: Diagnosis not present

## 2024-04-28 DIAGNOSIS — K219 Gastro-esophageal reflux disease without esophagitis: Secondary | ICD-10-CM | POA: Diagnosis not present

## 2024-04-28 DIAGNOSIS — F5101 Primary insomnia: Secondary | ICD-10-CM | POA: Diagnosis not present

## 2024-04-28 DIAGNOSIS — Z23 Encounter for immunization: Secondary | ICD-10-CM | POA: Diagnosis not present

## 2024-04-29 ENCOUNTER — Encounter (INDEPENDENT_AMBULATORY_CARE_PROVIDER_SITE_OTHER): Payer: Self-pay | Admitting: *Deleted

## 2024-05-12 ENCOUNTER — Telehealth: Payer: Self-pay

## 2024-05-12 NOTE — Telephone Encounter (Signed)
 Who is your primary care physician: Melissa Landry  Reasons for the colonoscopy: Hx polyps  Have you had a colonoscopy before?  Yes Dr.cathy [facility]  Do you have family history of colon cancer?  Yes Aunt  Previous colonoscopy with polyps removed? yes  Do you have a history colorectal cancer?   no  Are you diabetic? If yes, Type 1 or Type 2?    no  Do you have a prosthetic or mechanical heart valve? no  Do you have a pacemaker/defibrillator?   no  Have you had endocarditis/atrial fibrillation? no  Have you had joint replacement within the last 12 months?  no  Do you tend to be constipated or have to use laxatives? no  Do you have any history of drugs or alchohol?  no  Do you use supplemental oxygen?  no  Have you had a stroke or heart attack within the last 6 months? no  Do you take weight loss medication?  no  For female patients: have you had a hysterectomy?  no                                     are you post menopausal?       no                                            do you still have your menstrual cycle? no      Do you take any blood-thinning medications such as: (aspirin , warfarin, Plavix, Aggrenox)  no  If yes we need the name, milligram, dosage and who is prescribing doctor  Current Outpatient Medications on File Prior to Visit  Medication Sig Dispense Refill   buPROPion  (WELLBUTRIN  XL) 150 MG 24 hr tablet Take 1 tablet (150 mg total) by mouth daily. 30 tablet 0   busPIRone  (BUSPAR ) 10 MG tablet Take 10-20 mg by mouth See admin instructions. Take 20 mg by mouth in the morning and 10 mg by mouth in the evening.     calcium carbonate (OSCAL) 1500 (600 Ca) MG TABS tablet Take by mouth 2 (two) times daily with a meal.     famotidine (PEPCID) 20 MG tablet Take 20 mg by mouth 2 (two) times daily as needed.     Multiple Vitamin (MULTIVITAMIN WITH MINERALS) TABS tablet Take 1 tablet by mouth daily.     venlafaxine XR (EFFEXOR-XR) 75 MG 24 hr capsule Take 75 mg by  mouth daily.     No current facility-administered medications on file prior to visit.    No Known Allergies   [facility] [place]   Primary Insurance Name: [facility] [id number]  Best number where you can be reached: [phone] Melissa Landry

## 2024-06-14 NOTE — Telephone Encounter (Signed)
 Do we know when last colonoscopy was completed? I tried to look in Care Everywhere and saw pathology from 2010. Unsure if anything more recent. If it's been years or more, go ahead and arrange. ASA 2.

## 2024-06-15 ENCOUNTER — Encounter: Payer: Self-pay | Admitting: *Deleted

## 2024-06-15 ENCOUNTER — Other Ambulatory Visit: Payer: Self-pay | Admitting: *Deleted

## 2024-06-15 MED ORDER — PEG 3350-KCL-NA BICARB-NACL 420 G PO SOLR: 4000.0000 mL | Freq: Once | ORAL | 0 refills | Status: AC

## 2024-06-15 NOTE — Telephone Encounter (Signed)
 Pt has been scheduled for 06/25/24 with Dr.Caver. instructions mailed and prep sent to pharmacy

## 2024-06-25 ENCOUNTER — Ambulatory Visit (HOSPITAL_COMMUNITY): Admitting: Anesthesiology

## 2024-06-25 ENCOUNTER — Other Ambulatory Visit: Payer: Self-pay

## 2024-06-25 ENCOUNTER — Encounter (HOSPITAL_COMMUNITY): Admission: RE | Disposition: A | Payer: Self-pay | Attending: Internal Medicine

## 2024-06-25 ENCOUNTER — Ambulatory Visit (HOSPITAL_COMMUNITY)
Admission: RE | Admit: 2024-06-25 | Discharge: 2024-06-25 | Disposition: A | Discharge status: Home / Self Care | Attending: Internal Medicine | Admitting: Internal Medicine

## 2024-06-25 ENCOUNTER — Encounter (HOSPITAL_COMMUNITY): Payer: Self-pay | Admitting: Internal Medicine

## 2024-06-25 DIAGNOSIS — Q438 Other specified congenital malformations of intestine: Secondary | ICD-10-CM | POA: Diagnosis not present

## 2024-06-25 DIAGNOSIS — F32A Depression, unspecified: Secondary | ICD-10-CM | POA: Insufficient documentation

## 2024-06-25 DIAGNOSIS — I10 Essential (primary) hypertension: Secondary | ICD-10-CM | POA: Diagnosis not present

## 2024-06-25 DIAGNOSIS — F418 Other specified anxiety disorders: Secondary | ICD-10-CM | POA: Diagnosis not present

## 2024-06-25 DIAGNOSIS — K562 Volvulus: Secondary | ICD-10-CM | POA: Diagnosis not present

## 2024-06-25 DIAGNOSIS — K5730 Diverticulosis of large intestine without perforation or abscess without bleeding: Secondary | ICD-10-CM | POA: Insufficient documentation

## 2024-06-25 DIAGNOSIS — Z800 Family history of malignant neoplasm of digestive organs: Secondary | ICD-10-CM | POA: Diagnosis not present

## 2024-06-25 DIAGNOSIS — F419 Anxiety disorder, unspecified: Secondary | ICD-10-CM | POA: Insufficient documentation

## 2024-06-25 DIAGNOSIS — K648 Other hemorrhoids: Secondary | ICD-10-CM | POA: Insufficient documentation

## 2024-06-25 DIAGNOSIS — Z1211 Encounter for screening for malignant neoplasm of colon: Secondary | ICD-10-CM | POA: Diagnosis not present

## 2024-06-25 HISTORY — PX: COLONOSCOPY: SHX5424

## 2024-06-25 SURGERY — COLONOSCOPY
Anesthesia: Monitor Anesthesia Care

## 2024-06-25 MED ORDER — LACTATED RINGERS IV SOLN: INTRAVENOUS | Status: DC

## 2024-06-25 MED ORDER — PROPOFOL 500 MG/50ML IV EMUL
INTRAVENOUS | Status: DC | PRN
  Administered 2024-06-25: 13:00:00 100 mg via INTRAVENOUS
  Administered 2024-06-25: 13:00:00 150 ug/kg/min via INTRAVENOUS

## 2024-06-25 MED ORDER — LIDOCAINE 2% (20 MG/ML) 5 ML SYRINGE
INTRAMUSCULAR | Status: DC | PRN
  Administered 2024-06-25: 13:00:00 100 mg via INTRAVENOUS

## 2024-06-25 NOTE — Op Note (Signed)
 [facility] Patient Name: Melissa Landry Procedure Date: 06/25/2024 12:37 PM MRN: [id number] Date of Birth: Nov 28, 1955 Attending MD: Carlin POUR. Cindie , [place], [id number] CSN: [id number] Age: 68 Admit Type: Outpatient Procedure:                Colonoscopy Indications:              Screening for colorectal malignant neoplasm Providers:                Carlin POUR. Cindie, DO, Devere Lodge, Dorcas Lenis,                            Technician Referring MD:              Medicines:                See the Anesthesia note for documentation of the                            administered medications Complications:            No immediate complications. Estimated Blood Loss:     Estimated blood loss: none. Procedure:                Pre-Anesthesia Assessment:                           - The anesthesia plan was to use monitored                            anesthesia care (MAC).                           After obtaining informed consent, the colonoscope                            was passed under direct vision. Throughout the                            procedure, the patient's blood pressure, pulse, and                            oxygen saturations were monitored continuously. The                            PCF-HQ190L ([id number]) Peds Colon was introduced                            through the anus and advanced to the the cecum,                            identified by appendiceal orifice and ileocecal                            valve. The colonoscopy was somewhat difficult due                            to a redundant colon and significant looping.  Successful completion of the procedure was aided by                            applying abdominal pressure. The patient tolerated                            the procedure well. The quality of the bowel                            preparation was evaluated using the BBPS [facility]                            Bowel Preparation Scale)  with scores of: Right                            Colon = 2 (minor amount of residual staining, small                            fragments of stool and/or opaque liquid, but mucosa                            seen well), Transverse Colon = 3 (entire mucosa                            seen well with no residual staining, small                            fragments of stool or opaque liquid) and Left Colon                            = 3 (entire mucosa seen well with no residual                            staining, small fragments of stool or opaque                            liquid). The total BBPS score equals 8. The quality                            of the bowel preparation was good. Scope In: 12:49:03 PM Scope Out: 1:04:15 PM Scope Withdrawal Time: 0 hours 9 minutes 19 seconds  Total Procedure Duration: 0 hours 15 minutes 12 seconds  Findings:      Non-bleeding internal hemorrhoids were found.      Multiple medium-mouthed and small-mouthed diverticula were found in the       sigmoid colon.      The exam was otherwise without abnormality. Impression:               - Non-bleeding internal hemorrhoids.                           - Diverticulosis in the sigmoid colon.                           -  The examination was otherwise normal.                           - No specimens collected. Moderate Sedation:      Per Anesthesia Care Recommendation:           - Patient has a contact number available for                            emergencies. The signs and symptoms of potential                            delayed complications were discussed with the                            patient. Return to normal activities tomorrow.                            Written discharge instructions were provided to the                            patient.                           - Resume previous diet.                           - Continue present medications.                           - Repeat colonoscopy in 10  years for screening                            purposes.                           - Return to GI clinic PRN. Procedure Code(s):        --- Professional ---                           H9878, Colorectal cancer screening; colonoscopy on                            individual not meeting criteria for high risk Diagnosis Code(s):        --- Professional ---                           Z12.11, Encounter for screening for malignant                            neoplasm of colon                           K64.8, Other hemorrhoids                           K57.30, Diverticulosis of large intestine without  perforation or abscess without bleeding CPT copyright 2022 American Medical Association. All rights reserved. The codes documented in this report are preliminary and upon coder review may  be revised to meet current compliance requirements. Carlin POUR. Cindie, DO Carlin POUR. Cindie, DO 06/25/2024 1:06:58 PM This report has been signed electronically. Number of Addenda: 0

## 2024-06-25 NOTE — H&P (Signed)
 Primary Care Physician:  Jolee Elsie RAMAN, PA Primary Gastroenterologist:  Dr. Cindie  Pre-Procedure History & Physical: HPI:  Melissa Landry is a 68 y.o. female is here for a colonoscopy for colon cancer screening purposes.  Patient reports family history of colon cancer in 1 aunt. Past Medical History:  Diagnosis Date   Anxiety    Depression    Pneumonia     Past Surgical History:  Procedure Laterality Date   CESAREAN SECTION     CHEST TUBE INSERTION     for PNA   chest tube removal     ORIF ANKLE FRACTURE Left 03/26/2017   Procedure: OPEN REDUCTION INTERNAL FIXATION (ORIF) ANKLE FRACTURE;  Surgeon: Addie Cordella Hamilton, MD;  Location: [facility] OR;  Service: Orthopedics;  Laterality: Left;   TONSILLECTOMY     age 78    Prior to Admission medications  Medication Sig Start Date End Date Taking? Authorizing Provider  buPROPion  (WELLBUTRIN  XL) 150 MG 24 hr tablet Take 1 tablet (150 mg total) by mouth daily. 09/24/23  Yes Carrion-Carrero, Margely, MD  busPIRone  (BUSPAR ) 10 MG tablet Take 10-20 mg by mouth See admin instructions. Take 20 mg by mouth in the morning and 10 mg by mouth in the evening.   Yes [provider]  calcium carbonate (OSCAL) 1500 (600 Ca) MG TABS tablet Take by mouth 2 (two) times daily with a meal.   Yes [provider]  famotidine (PEPCID) 20 MG tablet Take 20 mg by mouth 2 (two) times daily as needed. 04/29/24  Yes [provider]  Multiple Vitamin (MULTIVITAMIN WITH MINERALS) TABS tablet Take 1 tablet by mouth daily. 09/24/23  Yes Carrion-Carrero, Margely, MD  venlafaxine XR (EFFEXOR-XR) 75 MG 24 hr capsule Take 75 mg by mouth daily. 05/06/24  Yes [provider]    Allergies as of 06/15/2024   (No Known Allergies)    History reviewed. No pertinent family history.  Social History   Socioeconomic History   Marital status: Widowed    Spouse name: Not on file   Number of children: Not on file   Years of education: Not on  file   Highest education level: Not on file  Occupational History   Not on file  Tobacco Use   Smoking status: Never   Smokeless tobacco: Never  Vaping Use   Vaping status: Never Used  Substance and Sexual Activity   Alcohol use: No   Drug use: No   Sexual activity: Not on file  Other Topics Concern   Not on file  Social History Narrative   Not on file   Social Drivers of Health   Tobacco Use: Low Risk (06/25/2024)   Patient History    Smoking Tobacco Use: Never    Smokeless Tobacco Use: Never    Passive Exposure: Not on file  Financial Resource Strain: Not on file  Food Insecurity: No Food Insecurity (09/19/2023)   Hunger Vital Sign    Worried About Running Out of Food in the Last Year: Never true    Ran Out of Food in the Last Year: Never true  Transportation Needs: No Transportation Needs (09/19/2023)   PRAPARE - Administrator, Civil Service (Medical): No    Lack of Transportation (Non-Medical): No  Physical Activity: Insufficiently Active (01/07/2023)   Received from [facility]   Exercise Vital Sign    On average, how many days per week do you engage in moderate to strenuous exercise (like a brisk walk)?:  3 days    On average, how many minutes do you engage in exercise at this level?: 20 min  Stress: Stress Concern Present (01/07/2023)   Received from [facility] - Occupational Stress Questionnaire    Feeling of Stress : To some extent  Social Connections: Not on file  Intimate Partner Violence: Not At Risk (09/19/2023)   Humiliation, Afraid, Rape, and Kick questionnaire    Fear of Current or Ex-Partner: No    Emotionally Abused: No    Physically Abused: No    Sexually Abused: No  Depression (PHQ2-9): Medium Risk (09/23/2023)   Depression (PHQ2-9)    PHQ-2 Score: 8  Alcohol Screen: Not on file  Housing: Not on file  Utilities: Not At Risk (09/19/2023)   [facility] Utilities    Threatened with loss of  utilities: No  Health Literacy: Low Risk (01/12/2024)   Received from [facility]   Health Literacy    : Never    Review of Systems: See HPI, otherwise negative ROS  Physical Exam: Vital signs in last 24 hours: Temp:  [97.6 F (36.4 C)] 97.6 F (36.4 C) (12/18 1200) Pulse Rate:  [69] 69 (12/18 1200) Resp:  [18] 18 (12/18 1200) BP: (132)/(85) 132/85 (12/18 1200) SpO2:  [99 %] 99 % (12/18 1200) Weight:  [102.1 kg] 102.1 kg (12/18 1200)   General:   Alert,  Well-developed, well-nourished, pleasant and cooperative in NAD Head:  Normocephalic and atraumatic. Eyes:  Sclera clear, no icterus.   Conjunctiva pink. Ears:  Normal auditory acuity. Nose:  No deformity, discharge,  or lesions. Msk:  Symmetrical without gross deformities. Normal posture. Extremities:  Without clubbing or edema. Neurologic:  Alert and  oriented x4;  grossly normal neurologically. Skin:  Intact without significant lesions or rashes. Psych:  Alert and cooperative. Normal mood and affect.  Impression/Plan: Melissa Landry is here for a colonoscopy to be performed for colon cancer screening purposes.  The risks of the procedure including infection, bleed, or perforation as well as benefits, limitations, alternatives and imponderables have been reviewed with the patient. Questions have been answered. All parties agreeable.

## 2024-06-25 NOTE — Transfer of Care (Signed)
 Immediate Anesthesia Transfer of Care Note  Patient: Melissa Landry  Procedure(s) Performed: COLONOSCOPY  Patient Location: Short Stay  Anesthesia Type:MAC  Level of Consciousness: awake and patient cooperative  Airway & Oxygen Therapy: Patient Spontanous Breathing  Post-op Assessment: Report given to RN and Post -op Vital signs reviewed and stable  Post vital signs: Reviewed and stable  Last Vitals:  Vitals Value Taken Time  BP 117/68 06/25/24 13:11  Temp    Pulse 73 06/25/24 13:11  Resp 18 06/25/24 13:11  SpO2 98% on RA 06/25/24 13:11    Last Pain:  Vitals:   06/25/24 1245  TempSrc:   PainSc: 0-No pain      Patients Stated Pain Goal: 5 (06/25/24 1200)  Complications: No notable events documented.

## 2024-06-25 NOTE — Discharge Instructions (Signed)

## 2024-06-25 NOTE — Anesthesia Preprocedure Evaluation (Signed)
 Anesthesia Evaluation  Patient identified by MRN, date of birth, ID band Patient awake    Reviewed: Allergy & Precautions, H&P , NPO status , Patient's Chart, lab work & pertinent test results, reviewed documented beta blocker date and time   Airway Mallampati: II  TM Distance: >3 FB Neck ROM: full    Dental no notable dental hx.    Pulmonary pneumonia   Pulmonary exam normal breath sounds clear to auscultation       Cardiovascular Exercise Tolerance: Good hypertension, negative cardio ROS  Rhythm:regular Rate:Normal     Neuro/Psych  PSYCHIATRIC DISORDERS Anxiety Depression    negative neurological ROS     GI/Hepatic negative GI ROS, Neg liver ROS,,,  Endo/Other  negative endocrine ROS    Renal/GU negative Renal ROS  negative genitourinary   Musculoskeletal   Abdominal   Peds  Hematology negative hematology ROS (+)   Anesthesia Other Findings   Reproductive/Obstetrics negative OB ROS                              Anesthesia Physical Anesthesia Plan  ASA: 2  Anesthesia Plan: MAC   Post-op Pain Management:    Induction:   PONV Risk Score and Plan: Propofol  infusion  Airway Management Planned:   Additional Equipment:   Intra-op Plan:   Post-operative Plan:   Informed Consent: I have reviewed the patients History and Physical, chart, labs and discussed the procedure including the risks, benefits and alternatives for the proposed anesthesia with the patient or authorized representative who has indicated his/her understanding and acceptance.     Dental Advisory Given  Plan Discussed with: CRNA  Anesthesia Plan Comments:         Anesthesia Quick Evaluation

## 2024-06-26 ENCOUNTER — Encounter (HOSPITAL_COMMUNITY): Payer: Self-pay | Admitting: Internal Medicine

## 2024-06-28 NOTE — Anesthesia Postprocedure Evaluation (Signed)
"   Anesthesia Post Note  Patient: Melissa Landry  Procedure(s) Performed: COLONOSCOPY  Patient location during evaluation: Phase II Anesthesia Type: MAC Level of consciousness: awake Pain management: pain level controlled Vital Signs Assessment: post-procedure vital signs reviewed and stable Respiratory status: spontaneous breathing and respiratory function stable Cardiovascular status: blood pressure returned to baseline and stable Postop Assessment: no headache and no apparent nausea or vomiting Anesthetic complications: no Comments: Late entry   No notable events documented.   Last Vitals:  Vitals:   06/25/24 1200 06/25/24 1308  BP: 132/85 117/68  Pulse: 69 76  Resp: 18 19  Temp: 36.4 C 36.4 C  SpO2: 99% 98%    Last Pain:  Vitals:   06/25/24 1308  TempSrc: Oral  PainSc: 0-No pain                 Yvonna PARAS Uchechukwu Dhawan      "
